# Patient Record
Sex: Female | Born: 1975 | Race: White | Hispanic: No | Marital: Single | Smoking: Current every day smoker
Health system: Southern US, Community
[De-identification: ages and names within clinical notes are randomized; demographics above are authoritative.]

## PROBLEM LIST (undated history)

## (undated) DIAGNOSIS — M1990 Unspecified osteoarthritis, unspecified site: Secondary | ICD-10-CM

## (undated) DIAGNOSIS — I10 Essential (primary) hypertension: Secondary | ICD-10-CM

## (undated) DIAGNOSIS — M797 Fibromyalgia: Secondary | ICD-10-CM

## (undated) DIAGNOSIS — J449 Chronic obstructive pulmonary disease, unspecified: Secondary | ICD-10-CM

## (undated) DIAGNOSIS — E785 Hyperlipidemia, unspecified: Secondary | ICD-10-CM

## (undated) HISTORY — PX: KIDNEY SURGERY: SHX687

## (undated) HISTORY — PX: TUBAL LIGATION: SHX77

---

## 1999-06-18 ENCOUNTER — Inpatient Hospital Stay (HOSPITAL_COMMUNITY): Admission: AD | Admit: 1999-06-18 | Discharge: 1999-06-18 | Payer: Self-pay | Admitting: *Deleted

## 1999-06-22 ENCOUNTER — Inpatient Hospital Stay (HOSPITAL_COMMUNITY): Admission: AD | Admit: 1999-06-22 | Discharge: 1999-06-22 | Payer: Self-pay | Admitting: Obstetrics

## 1999-07-13 ENCOUNTER — Ambulatory Visit (HOSPITAL_COMMUNITY): Admission: RE | Admit: 1999-07-13 | Discharge: 1999-07-13 | Payer: Self-pay | Admitting: *Deleted

## 1999-08-15 ENCOUNTER — Ambulatory Visit (HOSPITAL_COMMUNITY): Admission: RE | Admit: 1999-08-15 | Discharge: 1999-08-15 | Payer: Self-pay | Admitting: *Deleted

## 1999-09-24 ENCOUNTER — Inpatient Hospital Stay (HOSPITAL_COMMUNITY): Admission: AD | Admit: 1999-09-24 | Discharge: 1999-09-25 | Payer: Self-pay | Admitting: *Deleted

## 2001-01-11 ENCOUNTER — Inpatient Hospital Stay (HOSPITAL_COMMUNITY): Admission: AD | Admit: 2001-01-11 | Discharge: 2001-01-11 | Payer: Self-pay | Admitting: Obstetrics & Gynecology

## 2001-01-16 ENCOUNTER — Inpatient Hospital Stay (HOSPITAL_COMMUNITY): Admission: RE | Admit: 2001-01-16 | Discharge: 2001-01-16 | Payer: Self-pay | Admitting: Obstetrics & Gynecology

## 2001-01-16 ENCOUNTER — Encounter: Payer: Self-pay | Admitting: Obstetrics & Gynecology

## 2001-02-24 ENCOUNTER — Inpatient Hospital Stay (HOSPITAL_COMMUNITY): Admission: AD | Admit: 2001-02-24 | Discharge: 2001-02-26 | Payer: Self-pay | Admitting: Obstetrics

## 2002-10-31 ENCOUNTER — Inpatient Hospital Stay (HOSPITAL_COMMUNITY): Admission: AD | Admit: 2002-10-31 | Discharge: 2002-10-31 | Payer: Self-pay | Admitting: Family Medicine

## 2002-11-01 ENCOUNTER — Encounter: Payer: Self-pay | Admitting: *Deleted

## 2002-11-13 ENCOUNTER — Encounter: Admission: RE | Admit: 2002-11-13 | Discharge: 2002-11-13 | Payer: Self-pay | Admitting: *Deleted

## 2002-11-27 ENCOUNTER — Encounter: Admission: RE | Admit: 2002-11-27 | Discharge: 2002-11-27 | Payer: Self-pay | Admitting: *Deleted

## 2002-12-09 ENCOUNTER — Encounter: Admission: RE | Admit: 2002-12-09 | Discharge: 2002-12-09 | Payer: Self-pay | Admitting: *Deleted

## 2002-12-16 ENCOUNTER — Encounter: Admission: RE | Admit: 2002-12-16 | Discharge: 2002-12-16 | Payer: Self-pay | Admitting: *Deleted

## 2002-12-23 ENCOUNTER — Encounter: Admission: RE | Admit: 2002-12-23 | Discharge: 2002-12-23 | Payer: Self-pay | Admitting: *Deleted

## 2002-12-30 ENCOUNTER — Inpatient Hospital Stay (HOSPITAL_COMMUNITY): Admission: AD | Admit: 2002-12-30 | Discharge: 2003-01-01 | Payer: Self-pay | Admitting: *Deleted

## 2007-01-11 ENCOUNTER — Emergency Department: Payer: Self-pay | Admitting: Emergency Medicine

## 2007-05-12 ENCOUNTER — Emergency Department: Payer: Self-pay | Admitting: Emergency Medicine

## 2009-01-01 ENCOUNTER — Emergency Department: Payer: Self-pay | Admitting: Emergency Medicine

## 2013-09-04 ENCOUNTER — Emergency Department: Payer: Self-pay | Admitting: Emergency Medicine

## 2013-09-04 LAB — URINALYSIS, COMPLETE
Bilirubin,UR: NEGATIVE
Blood: NEGATIVE
Glucose,UR: NEGATIVE mg/dL (ref 0–75)
Ketone: NEGATIVE
Nitrite: POSITIVE
Ph: 6 (ref 4.5–8.0)
Protein: NEGATIVE
RBC,UR: 1 /HPF (ref 0–5)
Specific Gravity: 1.014 (ref 1.003–1.030)
Squamous Epithelial: 3
WBC UR: 7 /HPF (ref 0–5)

## 2013-11-01 ENCOUNTER — Emergency Department: Payer: Self-pay | Admitting: Emergency Medicine

## 2014-03-10 ENCOUNTER — Ambulatory Visit: Payer: Self-pay | Admitting: Family Medicine

## 2014-03-17 ENCOUNTER — Emergency Department: Payer: Self-pay | Admitting: Emergency Medicine

## 2014-08-18 ENCOUNTER — Emergency Department: Payer: Self-pay | Admitting: Internal Medicine

## 2014-12-23 ENCOUNTER — Other Ambulatory Visit: Payer: Self-pay

## 2014-12-23 ENCOUNTER — Emergency Department: Payer: Medicaid Other

## 2014-12-23 ENCOUNTER — Other Ambulatory Visit: Payer: Medicaid Other

## 2014-12-23 ENCOUNTER — Emergency Department
Admission: EM | Admit: 2014-12-23 | Discharge: 2014-12-23 | Disposition: A | Discharge status: Home / Self Care | Payer: Medicaid Other | Attending: Student | Admitting: Student

## 2014-12-23 DIAGNOSIS — R079 Chest pain, unspecified: Secondary | ICD-10-CM | POA: Insufficient documentation

## 2014-12-23 DIAGNOSIS — Z720 Tobacco use: Secondary | ICD-10-CM | POA: Diagnosis not present

## 2014-12-23 DIAGNOSIS — R4182 Altered mental status, unspecified: Secondary | ICD-10-CM | POA: Insufficient documentation

## 2014-12-23 DIAGNOSIS — R200 Anesthesia of skin: Secondary | ICD-10-CM | POA: Diagnosis present

## 2014-12-23 DIAGNOSIS — R202 Paresthesia of skin: Secondary | ICD-10-CM | POA: Diagnosis not present

## 2014-12-23 DIAGNOSIS — I10 Essential (primary) hypertension: Secondary | ICD-10-CM | POA: Insufficient documentation

## 2014-12-23 DIAGNOSIS — R51 Headache: Secondary | ICD-10-CM | POA: Diagnosis not present

## 2014-12-23 HISTORY — DX: Unspecified osteoarthritis, unspecified site: M19.90

## 2014-12-23 HISTORY — DX: Essential (primary) hypertension: I10

## 2014-12-23 HISTORY — DX: Fibromyalgia: M79.7

## 2014-12-23 HISTORY — DX: Hyperlipidemia, unspecified: E78.5

## 2014-12-23 LAB — CBC
HCT: 42.8 % (ref 35.0–47.0)
Hemoglobin: 14.8 g/dL (ref 12.0–16.0)
MCH: 31.9 pg (ref 26.0–34.0)
MCHC: 34.6 g/dL (ref 32.0–36.0)
MCV: 92 fL (ref 80.0–100.0)
Platelets: 246 10*3/uL (ref 150–440)
RBC: 4.65 MIL/uL (ref 3.80–5.20)
RDW: 13.2 % (ref 11.5–14.5)
WBC: 9 10*3/uL (ref 3.6–11.0)

## 2014-12-23 LAB — APTT: aPTT: 29 seconds (ref 24–36)

## 2014-12-23 LAB — COMPREHENSIVE METABOLIC PANEL
ALBUMIN: 3.9 g/dL (ref 3.5–5.0)
ALT: 30 U/L (ref 14–54)
ANION GAP: 7 (ref 5–15)
AST: 30 U/L (ref 15–41)
Alkaline Phosphatase: 64 U/L (ref 38–126)
BILIRUBIN TOTAL: 0.4 mg/dL (ref 0.3–1.2)
BUN: 9 mg/dL (ref 6–20)
CALCIUM: 9.6 mg/dL (ref 8.9–10.3)
CHLORIDE: 105 mmol/L (ref 101–111)
CO2: 25 mmol/L (ref 22–32)
Creatinine, Ser: 0.7 mg/dL (ref 0.44–1.00)
GFR calc non Af Amer: >60 mL/min (ref >60)
Glucose, Bld: 97 mg/dL (ref 65–99)
Potassium: 4.3 mmol/L (ref 3.5–5.1)
SODIUM: 137 mmol/L (ref 135–145)
TOTAL PROTEIN: 6.9 g/dL (ref 6.5–8.1)

## 2014-12-23 LAB — PROTIME-INR
INR: 0.88
Prothrombin Time: 12.1 seconds (ref 11.4–15.0)

## 2014-12-23 LAB — TROPONIN I: Troponin I: <0.03 ng/mL (ref <0.031)

## 2014-12-23 MED ORDER — METHYLPREDNISOLONE SODIUM SUCC 125 MG IJ SOLR: 125.0000 mg | Freq: Once | INTRAMUSCULAR | Status: DC

## 2014-12-23 NOTE — Discharge Instructions (Signed)
Please seek medical attention for any high fevers, chest pain, shortness of breath, change in behavior, persistent vomiting, bloody stool or any other new or concerning symptoms. ° °Paresthesia °Paresthesia is an abnormal burning or prickling sensation. This sensation is generally felt in the hands, arms, legs, or feet. However, it may occur in any part of the body. It is usually not painful. The feeling may be described as: °· Tingling or numbness. °· "Pins and needles." °· Skin crawling. °· Buzzing. °· Limbs "falling asleep." °· Itching. °Most people experience temporary (transient) paresthesia at some time in their lives. °CAUSES  °Paresthesia may occur when you breathe too quickly (hyperventilation). It can also occur without any apparent cause. Commonly, paresthesia occurs when pressure is placed on a nerve. The feeling quickly goes away once the pressure is removed. For some people, however, paresthesia is a long-lasting (chronic) condition caused by an underlying disorder. The underlying disorder may be: °· A traumatic, direct injury to nerves. Examples include a: °¨ Broken (fractured) neck. °¨ Fractured skull. °· A disorder affecting the brain and spinal cord (central nervous system). Examples include: °¨ Transverse myelitis. °¨ Encephalitis. °¨ Transient ischemic attack. °¨ Multiple sclerosis. °¨ Stroke. °¨ Tumor or blood vessel problems, such as an arteriovenous malformation pressing against the brain or spinal cord. °· A condition that damages the peripheral nerves (peripheral neuropathy). Peripheral nerves are not part of the brain and spinal cord. These conditions include: °¨ Diabetes. °¨ Peripheral vascular disease. °¨ Nerve entrapment syndromes, such as carpal tunnel syndrome. °¨ Shingles. °¨ Hypothyroidism. °¨ Vitamin B12 deficiencies. °¨ Alcoholism. °¨ Heavy metal poisoning (lead, arsenic). °¨ Rheumatoid arthritis. °¨ Systemic lupus erythematosus. °DIAGNOSIS  °Your caregiver will attempt to find the  underlying cause of your paresthesia. Your caregiver may: °· Take your medical history. °· Perform a physical exam. °· Order various lab tests. °· Order imaging tests. °TREATMENT  °Treatment for paresthesia depends on the underlying cause. °HOME CARE INSTRUCTIONS °· Avoid drinking alcohol. °· You may consider massage or acupuncture to help relieve your symptoms. °· Keep all follow-up appointments as directed by your caregiver. °SEEK IMMEDIATE MEDICAL CARE IF:  °· You feel weak. °· You have trouble walking or moving. °· You have problems with speech or vision. °· You feel confused. °· You cannot control your bladder or bowel movements. °· You feel numbness after an injury. °· You faint. °· Your burning or prickling feeling gets worse when walking. °· You have pain, cramps, or dizziness. °· You develop a rash. °MAKE SURE YOU: °· Understand these instructions. °· Will watch your condition. °· Will get help right away if you are not doing well or get worse. °Document Released: 05/12/2002 Document Revised: 08/14/2011 Document Reviewed: 02/10/2011 °[employer]® Patient Information ©2015 [employer]. This information is not intended to replace advice given to you by your health care provider. Make sure you discuss any questions you have with your health care provider. ° °

## 2014-12-23 NOTE — ED Notes (Addendum)
Pt c/o having central chest pain on Monday night before bed, then waking with right arm numbness yesterday with periods of confusion and HA.the patient states today she is having numbness to right arm and face with unsteady gait, HA.Marland Kitchen. No noted neuro deficits or facial drooping at present.

## 2014-12-23 NOTE — ED Provider Notes (Signed)
[facility] Emergency Department Provider Note  ____________________________________________  Time seen: Approximately 2:00 PM  I have reviewed the triage vital signs and the nursing notes.   HISTORY  Chief Complaint Chest Pain; Numbness; and Altered Mental Status    HPI Karen Russell is a 39 y.o. female with hypertension, hyperlipidemia, fibromyalgia who presents for evaluation of constant right upper extremity and right lower extremity numbness and tingling since yesterday, gradual onset, constant since onset, current severity is moderate. She generally feels weak. She has also had headache over the past 2 days however this has resolved at this time. Additionally, 2 nights ago she developed central chest pain however this is resolved and never returned. She feels as if she has numbness in her right face as well. No recent illness. No modifying factors. No vomiting, diarrhea, fevers, chills. No family history of early CVA   Past Medical History  Diagnosis Date  . Hypertension   . Hyperlipemia   . Arthritis   . Fibromyalgia syndrome     There are no active problems to display for this patient.   Past Surgical History  Procedure Laterality Date  . Tubal ligation    . Kidney surgery      No current outpatient prescriptions on file.  Allergies Review of patient's allergies indicates no known allergies.  No family history on file.  Social History History  Substance Use Topics  . Smoking status: Current Every Day Smoker -- 1.00 packs/day    Types: Cigarettes  . Smokeless tobacco: Never Used  . Alcohol Use: No    Review of Systems Constitutional: No fever/chills Eyes: No visual changes. ENT: No sore throat. Cardiovascular: +chest pain. Respiratory: Denies shortness of breath. Gastrointestinal: No abdominal pain.  No nausea, no vomiting.  No diarrhea.  No constipation. Genitourinary: Negative for dysuria. Musculoskeletal: Negative for back  pain. Skin: Negative for rash. Neurological: Positive for headache, no focal weakness, positive for numbness.  10-point ROS otherwise negative.  ____________________________________________   PHYSICAL EXAM:  VITAL SIGNS: ED Triage Vitals  Enc Vitals Group     BP 12/23/14 1336 131/56 mmHg     Pulse Rate 12/23/14 1336 79     Resp --      Temp 12/23/14 1336 98.7 F (37.1 C)     Temp Source 12/23/14 1336 Oral     SpO2 12/23/14 1336 98 %     Weight 12/23/14 1336 168 lb (76.204 kg)     Height 12/23/14 1336  (1.575 m)     Head Cir --      Peak Flow --      Pain Score 12/23/14 1346 7     Pain Loc --      Pain Edu? --      Excl. in GC? --     Constitutional: Alert and oriented. Well appearing and in no acute distress. Eyes: Conjunctivae are normal. PERRL. EOMI. Head: Atraumatic. Nose: No congestion/rhinnorhea. Mouth/Throat: Mucous membranes are moist.  Oropharynx non-erythematous. Neck: No stridor.  He is to palpation in the right trapezius. Cardiovascular: Normal rate, regular rhythm. Grossly normal heart sounds.  Good peripheral circulation. Respiratory: Normal respiratory effort.  No retractions. Lungs CTAB. Gastrointestinal: Soft and nontender. No distention. No abdominal bruits. No CVA tenderness. Genitourinary: Deferred Musculoskeletal: No lower extremity tenderness nor edema.  No joint effusions. Neurologic:  Normal speech and language. Nerves II through XII intact. Normal finger-nose-finger. Plus out of 5 strength in the right upper extremity however her exam is  limited secondary to pain she is having in the right arm. 5 out of 5 strength in the remaining extremities. She has hyper algesia in the right upper cavity and right lower extremity. Skin:  Skin is warm, dry and intact. No rash noted. Psychiatric: Mood and affect are normal. Speech and behavior are normal.  ____________________________________________   LABS (all labs ordered are listed, but only abnormal  results are displayed)  Labs Reviewed  CBC  TROPONIN I  APTT  PROTIME-INR  COMPREHENSIVE METABOLIC PANEL  URINALYSIS COMPLETEWITH MICROSCOPIC ([facility] ONLY)  POC URINE PREG, ED   ____________________________________________  EKG  ED ECG REPORT I, Gayla DossGayle, Pharrell Ledford A, the attending physician, personally viewed and interpreted this ECG.   Date: 12/23/2014  EKG Time: 13:55  Rate: 100  Rhythm: normal sinus rhythm  Axis: normal  Intervals:none  ST&T Change: No acute ST segment elevation.  ____________________________________________  RADIOLOGY  CT head IMPRESSION: Stable and normal noncontrast CT appearance of the brain.   CXR IMPRESSION: No radiographic evidence of acute cardiopulmonary disease.  ____________________________________________   PROCEDURES  Procedure(s) performed: None  Critical Care performed: No  ____________________________________________   INITIAL IMPRESSION / ASSESSMENT AND PLAN / ED COURSE  Pertinent labs & imaging results that were available during my care of the patient were reviewed by me and considered in my medical decision making (see chart for details).  Karen Russell is a 39 y.o. female with hypertension, hyperlipidemia, fibromyalgia who presents for evaluation of constant right upper extremity and right lower extremity numbness and tingling since yesterday, gradual onset, constant since onset, current severity is moderate. Vital signs stable. She is afebrile. She had one episode of nonspecific chest pain 2 nights ago which resolved. EKG is reassuring, troponin negative. Doubt ACS. She is PERC negative and I doubt PE. Not consistent with acute aortic dissection. She has hyperalgesia in the right upper extremity, right lower extremity, mild weakness in the right upper extremity which I suspect is secondary to pain. CT head negative for any acute intracranial process. She is not a candidate for TPA as she is outside of the time frame/has been  having symptoms constantly since yesterday. Plan for MRI brain/C-spine to rule out stroke or acute cervical myelopathy. Care transferred to Dr. Derrill KayGoodman at 3:30 PM. ____________________________________________   FINAL CLINICAL IMPRESSION(S) / ED DIAGNOSES  Final diagnoses:  Numbness and tingling of right arm and leg      Gayla DossEryka A Glendine Swetz, MD 12/23/14 1535

## 2014-12-23 NOTE — ED Notes (Signed)
Patient transported to X-ray 

## 2014-12-23 NOTE — ED Provider Notes (Signed)
-----------------------------------------   5:39 PM on 12/23/2014 -----------------------------------------  Patient's MRI without any concerning findings. This point feel patient is safe for discharge home. I did discuss follow-up with patient. Will give neurology follow-up. Discussed return precautions with patient.  Phineas SemenGraydon Catelynn Sparger, MD 12/23/14 2121

## 2015-06-26 ENCOUNTER — Encounter: Payer: Self-pay | Admitting: Emergency Medicine

## 2015-06-26 ENCOUNTER — Emergency Department
Admission: EM | Admit: 2015-06-26 | Discharge: 2015-06-26 | Disposition: A | Discharge status: Home / Self Care | Payer: Medicaid Other | Attending: Emergency Medicine | Admitting: Emergency Medicine

## 2015-06-26 ENCOUNTER — Emergency Department: Payer: Medicaid Other

## 2015-06-26 DIAGNOSIS — I10 Essential (primary) hypertension: Secondary | ICD-10-CM | POA: Insufficient documentation

## 2015-06-26 DIAGNOSIS — J209 Acute bronchitis, unspecified: Secondary | ICD-10-CM | POA: Diagnosis not present

## 2015-06-26 DIAGNOSIS — R05 Cough: Secondary | ICD-10-CM | POA: Diagnosis present

## 2015-06-26 DIAGNOSIS — F17210 Nicotine dependence, cigarettes, uncomplicated: Secondary | ICD-10-CM | POA: Insufficient documentation

## 2015-06-26 MED ORDER — PREDNISONE 20 MG PO TABS
60.0000 mg | ORAL_TABLET | Freq: Once | ORAL | Status: AC
  Administered 2015-06-26: 60 mg via ORAL
  Filled 2015-06-26: qty 3

## 2015-06-26 MED ORDER — PREDNISONE 10 MG (21) PO TBPK: ORAL_TABLET | ORAL | Status: DC

## 2015-06-26 MED ORDER — HYDROCODONE-HOMATROPINE 5-1.5 MG/5ML PO SYRP: 5.0000 mL | ORAL_SOLUTION | Freq: Four times a day (QID) | ORAL | Status: DC | PRN

## 2015-06-26 MED ORDER — AMOXICILLIN 500 MG PO CAPS
500.0000 mg | ORAL_CAPSULE | Freq: Once | ORAL | Status: AC
  Administered 2015-06-26: 500 mg via ORAL
  Filled 2015-06-26: qty 1

## 2015-06-26 MED ORDER — AMOXICILLIN 500 MG PO TABS: 500.0000 mg | ORAL_TABLET | Freq: Three times a day (TID) | ORAL | Status: DC

## 2015-06-26 NOTE — Discharge Instructions (Signed)
Acute Bronchitis Bronchitis is inflammation of the airways that extend from the windpipe into the lungs (bronchi). The inflammation often causes mucus to develop. This leads to a cough, which is the most common symptom of bronchitis.  In acute bronchitis, the condition usually develops suddenly and goes away over time, usually in a couple weeks. Smoking, allergies, and asthma can make bronchitis worse. Repeated episodes of bronchitis may cause further lung problems.  CAUSES Acute bronchitis is most often caused by the same virus that causes a cold. The virus can spread from person to person (contagious) through coughing, sneezing, and touching contaminated objects. SIGNS AND SYMPTOMS   Cough.   Fever.   Coughing up mucus.   Body aches.   Chest congestion.   Chills.   Shortness of breath.   Sore throat.  DIAGNOSIS  Acute bronchitis is usually diagnosed through a physical exam. Your health care provider will also ask you questions about your medical history. Tests, such as chest X-rays, are sometimes done to rule out other conditions.  TREATMENT  Acute bronchitis usually goes away in a couple weeks. Oftentimes, no medical treatment is necessary. Medicines are sometimes given for relief of fever or cough. Antibiotic medicines are usually not needed but may be prescribed in certain situations. In some cases, an inhaler may be recommended to help reduce shortness of breath and control the cough. A cool mist vaporizer may also be used to help thin bronchial secretions and make it easier to clear the chest.  HOME CARE INSTRUCTIONS  Get plenty of rest.   Drink enough fluids to keep your urine clear or pale yellow (unless you have a medical condition that requires fluid restriction). Increasing fluids may help thin your respiratory secretions (sputum) and reduce chest congestion, and it will prevent dehydration.   Take medicines only as directed by your health care provider.  If  you were prescribed an antibiotic medicine, finish it all even if you start to feel better.  Avoid smoking and secondhand smoke. Exposure to cigarette smoke or irritating chemicals will make bronchitis worse. If you are a smoker, consider using nicotine gum or skin patches to help control withdrawal symptoms. Quitting smoking will help your lungs heal faster.   Reduce the chances of another bout of acute bronchitis by washing your hands frequently, avoiding people with cold symptoms, and trying not to touch your hands to your mouth, nose, or eyes.   Keep all follow-up visits as directed by your health care provider.  SEEK MEDICAL CARE IF: Your symptoms do not improve after 1 week of treatment.  SEEK IMMEDIATE MEDICAL CARE IF:  You develop an increased fever or chills.   You have chest pain.   You have severe shortness of breath.  You have bloody sputum.   You develop dehydration.  You faint or repeatedly feel like you are going to pass out.  You develop repeated vomiting.  You develop a severe headache. MAKE SURE YOU:   Understand these instructions.  Will watch your condition.  Will get help right away if you are not doing well or get worse.   This information is not intended to replace advice given to you by your health care provider. Make sure you discuss any questions you have with your health care provider.   Document Released: 06/29/2004 Document Revised: 06/12/2014 Document Reviewed: 11/12/2012 Elsevier Interactive Patient Education 2016 [employer]    Take antibiotics and cough medicine as directed. Encourage you to stop smoking as you're able. Symptoms  should improve over the next several days. If worsening return to the emergency room.

## 2015-06-26 NOTE — ED Provider Notes (Signed)
[facility] Emergency Department Provider Note  ____________________________________________  Time seen: Approximately 3:52 PM  I have reviewed the triage vital signs and the nursing notes.   HISTORY  Chief Complaint Cough    HPI Karen Russell is a 40 y.o. female with history of hypertension, fibromyalgia and arthritis who presents with persisting cough for the last few months. Has been worse this week. She has some shortness of breath, wheezing, head congestion with sinus pressure. She also has sore throat and your pain. She does smoke.   Past Medical History  Diagnosis Date  . Hypertension   . Hyperlipemia   . Arthritis   . Fibromyalgia syndrome     There are no active problems to display for this patient.   Past Surgical History  Procedure Laterality Date  . Tubal ligation    . Kidney surgery      Current Outpatient Rx  Name  Route  Sig  Dispense  Refill  . amoxicillin (AMOXIL) 500 MG tablet   Oral   Take 1 tablet (500 mg total) by mouth 3 (three) times daily.   30 tablet   0   . HYDROcodone-homatropine (HYCODAN) 5-1.5 MG/5ML syrup   Oral   Take 5 mLs by mouth every 6 (six) hours as needed for cough.   120 mL   0   . predniSONE (STERAPRED UNI-PAK 21 TAB) 10 MG (21) TBPK tablet      6 tablets on day 1, 5 tablets on day 2, 4 tablets on day 3, etc...   21 tablet   0     Allergies Review of patient's allergies indicates no known allergies.  No family history on file.  Social History Social History  Substance Use Topics  . Smoking status: Current Every Day Smoker -- 0.50 packs/day    Types: Cigarettes  . Smokeless tobacco: Never Used  . Alcohol Use: No    Review of Systems Constitutional: No fever/chills Eyes: No visual changes. ENT: sore throat.  As per HPI Cardiovascular: Denies chest pain. Respiratory:  shortness of breath. Gastrointestinal: No abdominal pain.  No nausea, no vomiting.  diarrhea.  No  constipation. Genitourinary: Negative for dysuria. Musculoskeletal: Negative for back pain. Skin: Negative for rash. Neurological: Negative for headaches, focal weakness or numbness. 10-point ROS otherwise negative.  ____________________________________________   PHYSICAL EXAM:  VITAL SIGNS: ED Triage Vitals  Enc Vitals Group     BP 06/26/15 1508 140/90 mmHg     Pulse Rate 06/26/15 1508 116     Resp 06/26/15 1508 20     Temp 06/26/15 1508 99 F (37.2 C)     Temp Source 06/26/15 1508 Oral     SpO2 06/26/15 1508 97 %     Weight 06/26/15 1508 160 lb (72.576 kg)     Height 06/26/15 1508  (1.575 m)     Head Cir --      Peak Flow --      Pain Score 06/26/15 1509 10     Pain Loc --      Pain Edu? --      Excl. in GC? --     Constitutional: Alert and oriented. Well appearing and in no acute distress. Eyes: Conjunctivae are normal. PERRL. EOMI. Ears:  Clear with normal landmarks. No erythema. Head: Atraumatic. Nose: No congestion/rhinnorhea. Mouth/Throat: Mucous membranes are moist.  Oropharynx non-erythematous. No lesions. Neck:  Supple.  No adenopathy.   Cardiovascular: Normal rate, regular rhythm. Grossly normal heart sounds.  Good peripheral circulation. Respiratory: Normal respiratory effort.  No retractions. Lungs CTAB. Gastrointestinal: Soft and nontender. No distention. No abdominal bruits. No CVA tenderness. Musculoskeletal: Nml ROM of upper and lower extremity joints. Neurologic:  Normal speech and language. No gross focal neurologic deficits are appreciated. No gait instability. Skin:  Skin is warm, dry and intact. No rash noted. Psychiatric: Mood and affect are normal. Speech and behavior are normal.  ____________________________________________   LABS (all labs ordered are listed, but only abnormal results are displayed)  Labs Reviewed - No data to  display ____________________________________________  EKG   ____________________________________________  RADIOLOGY  CLINICAL DATA: Cough on all for 3 months  EXAM: CHEST - 2 VIEW  COMPARISON: 12/23/2014  FINDINGS: The heart size and mediastinal contours are within normal limits. Both lungs are clear. The visualized skeletal structures are unremarkable.  IMPRESSION: No active disease.   Electronically Signed  By: Alcide Clever M.D.  On: 06/26/2015 16:17  ____________________________________________   PROCEDURES  Procedure(s) performed: None  Critical Care performed: No  ____________________________________________   INITIAL IMPRESSION / ASSESSMENT AND PLAN / ED COURSE  Pertinent labs & imaging results that were available during my care of the patient were reviewed by me and considered in my medical decision making (see chart for details).  40 year old female with history of smoking who presents with worsening cough over the last week. She has had cough and congestion for approximately 2 months. Chest x-ray is clear today. Concern for bronchitis aggravated by her smoking history. Given amoxicillin, prednisone taper and Hycodan. We'll follow up with urgent care or return to emergency room if not improving. ____________________________________________   FINAL CLINICAL IMPRESSION(S) / ED DIAGNOSES  Final diagnoses:  Acute bronchitis, unspecified organism      Ignacia Bayley, PA-C 06/26/15 1658  Arnaldo Natal, MD 06/26/15 [phone]

## 2015-06-26 NOTE — ED Notes (Signed)
Patient transported to X-ray 

## 2015-06-26 NOTE — ED Notes (Signed)
Intermittent cough x 4 months.

## 2015-06-26 NOTE — ED Notes (Signed)
Assessed per PA 

## 2015-09-03 ENCOUNTER — Encounter: Payer: Self-pay | Admitting: Emergency Medicine

## 2015-09-03 ENCOUNTER — Emergency Department: Payer: Medicaid Other

## 2015-09-03 ENCOUNTER — Emergency Department
Admission: EM | Admit: 2015-09-03 | Discharge: 2015-09-03 | Disposition: A | Discharge status: Home / Self Care | Payer: Medicaid Other | Attending: Emergency Medicine | Admitting: Emergency Medicine

## 2015-09-03 DIAGNOSIS — M1990 Unspecified osteoarthritis, unspecified site: Secondary | ICD-10-CM | POA: Insufficient documentation

## 2015-09-03 DIAGNOSIS — M545 Low back pain, unspecified: Secondary | ICD-10-CM

## 2015-09-03 DIAGNOSIS — N390 Urinary tract infection, site not specified: Secondary | ICD-10-CM | POA: Diagnosis not present

## 2015-09-03 DIAGNOSIS — M797 Fibromyalgia: Secondary | ICD-10-CM | POA: Diagnosis not present

## 2015-09-03 DIAGNOSIS — Y9389 Activity, other specified: Secondary | ICD-10-CM | POA: Insufficient documentation

## 2015-09-03 DIAGNOSIS — F17210 Nicotine dependence, cigarettes, uncomplicated: Secondary | ICD-10-CM | POA: Diagnosis not present

## 2015-09-03 DIAGNOSIS — E785 Hyperlipidemia, unspecified: Secondary | ICD-10-CM | POA: Insufficient documentation

## 2015-09-03 DIAGNOSIS — I10 Essential (primary) hypertension: Secondary | ICD-10-CM | POA: Diagnosis not present

## 2015-09-03 DIAGNOSIS — Y92410 Unspecified street and highway as the place of occurrence of the external cause: Secondary | ICD-10-CM | POA: Diagnosis not present

## 2015-09-03 DIAGNOSIS — Y999 Unspecified external cause status: Secondary | ICD-10-CM | POA: Diagnosis not present

## 2015-09-03 LAB — URINALYSIS COMPLETE WITH MICROSCOPIC (ARMC ONLY)
BILIRUBIN URINE: NEGATIVE
Glucose, UA: NEGATIVE mg/dL
Hgb urine dipstick: NEGATIVE
KETONES UR: NEGATIVE mg/dL
Nitrite: POSITIVE — AB
PH: 7 (ref 5.0–8.0)
Protein, ur: NEGATIVE mg/dL
SPECIFIC GRAVITY, URINE: 1.01 (ref 1.005–1.030)

## 2015-09-03 LAB — POCT PREGNANCY, URINE: Preg Test, Ur: NEGATIVE

## 2015-09-03 MED ORDER — CIPROFLOXACIN HCL 500 MG PO TABS: 500.0000 mg | ORAL_TABLET | Freq: Two times a day (BID) | ORAL | Status: DC

## 2015-09-03 MED ORDER — HYDROCODONE-ACETAMINOPHEN 5-325 MG PO TABS: 1.0000 | ORAL_TABLET | ORAL | Status: DC | PRN

## 2015-09-03 MED ORDER — HYDROCODONE-ACETAMINOPHEN 5-325 MG PO TABS
1.0000 | ORAL_TABLET | Freq: Once | ORAL | Status: AC
  Administered 2015-09-03: 1 via ORAL
  Filled 2015-09-03: qty 1

## 2015-09-03 MED ORDER — DIAZEPAM 2 MG PO TABS
2.0000 mg | ORAL_TABLET | Freq: Once | ORAL | Status: AC
  Administered 2015-09-03: 2 mg via ORAL
  Filled 2015-09-03: qty 1

## 2015-09-03 MED ORDER — CIPROFLOXACIN HCL 500 MG PO TABS
500.0000 mg | ORAL_TABLET | Freq: Once | ORAL | Status: AC
  Administered 2015-09-03: 500 mg via ORAL
  Filled 2015-09-03: qty 1

## 2015-09-03 NOTE — ED Notes (Signed)
Reviewed d/c instructions, follow-up care, prescriptions with pt. Pt verbalized understanding.  
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
## 2016-04-24 ENCOUNTER — Emergency Department
Admission: EM | Admit: 2016-04-24 | Discharge: 2016-04-24 | Disposition: A | Discharge status: Home / Self Care | Payer: Self-pay | Attending: Emergency Medicine | Admitting: Emergency Medicine

## 2016-04-24 ENCOUNTER — Emergency Department: Payer: Self-pay

## 2016-04-24 ENCOUNTER — Encounter: Payer: Self-pay | Admitting: *Deleted

## 2016-04-24 DIAGNOSIS — F17210 Nicotine dependence, cigarettes, uncomplicated: Secondary | ICD-10-CM | POA: Insufficient documentation

## 2016-04-24 DIAGNOSIS — I10 Essential (primary) hypertension: Secondary | ICD-10-CM | POA: Insufficient documentation

## 2016-04-24 DIAGNOSIS — J40 Bronchitis, not specified as acute or chronic: Secondary | ICD-10-CM | POA: Insufficient documentation

## 2016-04-24 DIAGNOSIS — Z79899 Other long term (current) drug therapy: Secondary | ICD-10-CM | POA: Insufficient documentation

## 2016-04-24 MED ORDER — AZITHROMYCIN 250 MG PO TABS: ORAL_TABLET | ORAL | 0 refills | Status: AC

## 2016-04-24 MED ORDER — IBUPROFEN 600 MG PO TABS: 600.0000 mg | ORAL_TABLET | Freq: Three times a day (TID) | ORAL | 0 refills | Status: DC | PRN

## 2016-04-24 MED ORDER — HYDROCOD POLST-CPM POLST ER 10-8 MG/5ML PO SUER: 5.0000 mL | Freq: Two times a day (BID) | ORAL | 0 refills | Status: DC

## 2016-04-24 MED ORDER — HYDROCOD POLST-CPM POLST ER 10-8 MG/5ML PO SUER
5.0000 mL | Freq: Once | ORAL | Status: AC
  Administered 2016-04-24: 5 mL via ORAL
  Filled 2016-04-24: qty 5

## 2016-04-24 NOTE — ED Notes (Signed)
See triage note  Cough for several day  Became worse yesterdya  Pain increases with cough and inspiration to chest

## 2016-04-24 NOTE — ED Triage Notes (Signed)
States URI for over 1 week, states she is coughing up white sputum and has back pain when she coughs, awake and alert In no acute distress

## 2016-04-24 NOTE — ED Provider Notes (Signed)
[facility] Emergency Department Provider Note   ____________________________________________   First MD Initiated Contact with Patient 04/24/16 1337     (approximate)  I have reviewed the triage vital signs and the nursing notes.   HISTORY  Chief Complaint Cough and Back Pain    HPI Karen Russell is a 40 y.o. female patient complaint URI signs and symptoms consistent mostly of productive cough 1 week. Patient to cough spell causes chest and body ache. Patient denies nasal congestion or rhinorrhea. Patient denies sore throat. Patient denies nausea vomiting or diarrhea. No palliative measures taken for this complaint.Patient rates her pain as a 10 over 10. Patient described a pain as "achy".  Past Medical History:  Diagnosis Date  . Arthritis   . Fibromyalgia syndrome   . Hyperlipemia   . Hypertension     There are no active problems to display for this patient.   Past Surgical History:  Procedure Laterality Date  . KIDNEY SURGERY    . TUBAL LIGATION      Prior to Admission medications   Medication Sig Start Date End Date Taking? Authorizing Provider  gabapentin (NEURONTIN) 400 MG capsule Take 400 mg by mouth 3 (three) times daily.   Yes Historical Provider, MD  lisinopril (PRINIVIL,ZESTRIL) 20 MG tablet Take 20 mg by mouth daily.   Yes Historical Provider, MD  simvastatin (ZOCOR) 20 MG tablet Take 20 mg by mouth daily.   Yes Historical Provider, MD  amoxicillin (AMOXIL) 500 MG tablet Take 1 tablet (500 mg total) by mouth 3 (three) times daily. 06/26/15   Ignacia Bayleyobert Tumey, PA-C  azithromycin (ZITHROMAX Z-PAK) 250 MG tablet Take 2 tablets (500 mg) on  Day 1,  followed by 1 tablet (250 mg) once daily on Days 2 through 5. 04/24/16 04/29/16  Joni Reiningonald K Smith, PA-C  chlorpheniramine-HYDROcodone (TUSSIONEX PENNKINETIC ER) 10-8 MG/5ML SUER Take 5 mLs by mouth 2 (two) times daily. 04/24/16   Joni Reiningonald K Smith, PA-C  ciprofloxacin (CIPRO) 500 MG tablet Take 1  tablet (500 mg total) by mouth 2 (two) times daily. 09/03/15   Tommi Rumpshonda L Summers, PA-C  HYDROcodone-acetaminophen (NORCO/VICODIN) 5-325 MG tablet Take 1 tablet by mouth every 4 (four) hours as needed for moderate pain. 09/03/15   Tommi Rumpshonda L Summers, PA-C  HYDROcodone-homatropine [facility](HYCODAN) 5-1.5 MG/5ML syrup Take 5 mLs by mouth every 6 (six) hours as needed for cough. 06/26/15   Ignacia Bayleyobert Tumey, PA-C  ibuprofen (ADVIL,MOTRIN) 600 MG tablet Take 1 tablet (600 mg total) by mouth every 8 (eight) hours as needed. 04/24/16   Joni Reiningonald K Smith, PA-C  predniSONE (STERAPRED UNI-PAK 21 TAB) 10 MG (21) TBPK tablet 6 tablets on day 1, 5 tablets on day 2, 4 tablets on day 3, etc... 06/26/15   Ignacia Bayleyobert Tumey, PA-C    Allergies Patient has no known allergies.  History reviewed. No pertinent family history.  Social History Social History  Substance Use Topics  . Smoking status: Current Every Day Smoker    Packs/day: 0.50    Types: Cigarettes  . Smokeless tobacco: Never Used  . Alcohol use No    Review of Systems Constitutional: No fever/chills Eyes: No visual changes. ENT: No sore throat. Cardiovascular: Denies chest pain. Respiratory: Denies shortness of breath. Gastrointestinal: No abdominal pain.  No nausea, no vomiting.  No diarrhea.  No constipation. Genitourinary: Negative for dysuria. Musculoskeletal: Negative for back pain. Skin: Negative for rash. Neurological: Negative for headaches, focal weakness or numbness. Endocrine:Hypertension and hyperlipidemia  ____________________________________________   PHYSICAL  EXAM:  VITAL SIGNS: ED Triage Vitals  Enc Vitals Group     BP 04/24/16 1315 136/78     Pulse Rate 04/24/16 1315 90     Resp 04/24/16 1315 18     Temp 04/24/16 1315 98.3 F (36.8 C)     Temp Source 04/24/16 1315 Oral     SpO2 04/24/16 1315 98 %     Weight 04/24/16 1315 150 lb (68 kg)     Height 04/24/16 1315 5\' 2"  (1.575 m)     Head Circumference --      Peak Flow --      Pain  Score 04/24/16 1316 10     Pain Loc --      Pain Edu? --      Excl. in GC? --     Constitutional: Alert and oriented. Well appearing and in no acute distress. Eyes: Conjunctivae are normal. PERRL. EOMI. Head: Atraumatic. Nose: No congestion/rhinnorhea. Mouth/Throat: Mucous membranes are moist.  Oropharynx non-erythematous. Neck: No stridor.  No cervical spine tenderness to palpation. Hematological/Lymphatic/Immunilogical: No cervical lymphadenopathy. Cardiovascular: Normal rate, regular rhythm. Grossly normal heart sounds.  Good peripheral circulation. Respiratory: Normal respiratory effort.  No retractions. Bilateral rhonchi breath sounds Gastrointestinal: Soft and nontender. No distention. No abdominal bruits. No CVA tenderness. Musculoskeletal: No lower extremity tenderness nor edema.  No joint effusions. Neurologic:  Normal speech and language. No gross focal neurologic deficits are appreciated. No gait instability. Skin:  Skin is warm, dry and intact. No rash noted. Psychiatric: Mood and affect are normal. Speech and behavior are normal.  ____________________________________________   LABS (all labs ordered are listed, but only abnormal results are displayed)  Labs Reviewed - No data to display ____________________________________________  EKG   ____________________________________________  RADIOLOGY Chest x-ray revealed hyperinflated lungs. No other acute findings.  ____________________________________________   PROCEDURES  Procedure(s) performed: None  Procedures  Critical Care performed: No  ____________________________________________   INITIAL IMPRESSION / ASSESSMENT AND PLAN / ED COURSE  Pertinent labs & imaging results that were available during my care of the patient were reviewed by me and considered in my medical decision making (see chart for details).  Bronchitis. She given discharge care instructions. Patient get a prescription for Zithromax,  Tussionex, and ibuprofen. Patient advised follow-up primary doctor condition persists.  Clinical Course      ____________________________________________   FINAL CLINICAL IMPRESSION(S) / ED DIAGNOSES  Final diagnoses:  Bronchitis      NEW MEDICATIONS STARTED DURING THIS VISIT:  New Prescriptions   AZITHROMYCIN (ZITHROMAX Z-PAK) 250 MG TABLET    Take 2 tablets (500 mg) on  Day 1,  followed by 1 tablet (250 mg) once daily on Days 2 through 5.   CHLORPHENIRAMINE-HYDROCODONE (TUSSIONEX PENNKINETIC ER) 10-8 MG/5ML SUER    Take 5 mLs by mouth 2 (two) times daily.   IBUPROFEN (ADVIL,MOTRIN) 600 MG TABLET    Take 1 tablet (600 mg total) by mouth every 8 (eight) hours as needed.     Note:  This document was prepared using Dragon voice recognition software and may include unintentional dictation errors.    Joni Reiningonald K Smith, PA-C 04/24/16 1427    Arnaldo NatalPaul F Malinda, MD 04/24/16 [phone]

## 2016-08-10 ENCOUNTER — Encounter: Payer: Self-pay | Admitting: *Deleted

## 2016-08-10 ENCOUNTER — Emergency Department: Payer: Medicaid Other

## 2016-08-10 ENCOUNTER — Inpatient Hospital Stay
Admission: EM | Admit: 2016-08-10 | Discharge: 2016-08-13 | DRG: 190 | Disposition: A | Discharge status: Home / Self Care | Payer: Medicaid Other | Attending: Internal Medicine | Admitting: Internal Medicine

## 2016-08-10 DIAGNOSIS — J45901 Unspecified asthma with (acute) exacerbation: Secondary | ICD-10-CM | POA: Diagnosis present

## 2016-08-10 DIAGNOSIS — J441 Chronic obstructive pulmonary disease with (acute) exacerbation: Secondary | ICD-10-CM

## 2016-08-10 DIAGNOSIS — M797 Fibromyalgia: Secondary | ICD-10-CM | POA: Diagnosis present

## 2016-08-10 DIAGNOSIS — J9601 Acute respiratory failure with hypoxia: Secondary | ICD-10-CM | POA: Diagnosis present

## 2016-08-10 DIAGNOSIS — J440 Chronic obstructive pulmonary disease with acute lower respiratory infection: Secondary | ICD-10-CM | POA: Diagnosis present

## 2016-08-10 DIAGNOSIS — J209 Acute bronchitis, unspecified: Secondary | ICD-10-CM | POA: Diagnosis present

## 2016-08-10 DIAGNOSIS — J9691 Respiratory failure, unspecified with hypoxia: Secondary | ICD-10-CM | POA: Diagnosis present

## 2016-08-10 DIAGNOSIS — I10 Essential (primary) hypertension: Secondary | ICD-10-CM | POA: Diagnosis present

## 2016-08-10 DIAGNOSIS — K219 Gastro-esophageal reflux disease without esophagitis: Secondary | ICD-10-CM | POA: Diagnosis present

## 2016-08-10 DIAGNOSIS — Z79899 Other long term (current) drug therapy: Secondary | ICD-10-CM

## 2016-08-10 DIAGNOSIS — F17210 Nicotine dependence, cigarettes, uncomplicated: Secondary | ICD-10-CM | POA: Diagnosis present

## 2016-08-10 DIAGNOSIS — E785 Hyperlipidemia, unspecified: Secondary | ICD-10-CM | POA: Diagnosis present

## 2016-08-10 HISTORY — DX: Respiratory failure, unspecified with hypoxia: J96.91

## 2016-08-10 LAB — BASIC METABOLIC PANEL
ANION GAP: 8 (ref 5–15)
BUN: 8 mg/dL (ref 6–20)
CALCIUM: 9.2 mg/dL (ref 8.9–10.3)
CO2: 23 mmol/L (ref 22–32)
Chloride: 105 mmol/L (ref 101–111)
Creatinine, Ser: 0.42 mg/dL — ABNORMAL LOW (ref 0.44–1.00)
Glucose, Bld: 126 mg/dL — ABNORMAL HIGH (ref 65–99)
Potassium: 4.5 mmol/L (ref 3.5–5.1)
SODIUM: 136 mmol/L (ref 135–145)

## 2016-08-10 LAB — CBC
HCT: 43.5 % (ref 35.0–47.0)
HEMOGLOBIN: 15.1 g/dL (ref 12.0–16.0)
MCH: 31.6 pg (ref 26.0–34.0)
MCHC: 34.7 g/dL (ref 32.0–36.0)
MCV: 91 fL (ref 80.0–100.0)
PLATELETS: 325 10*3/uL (ref 150–440)
RBC: 4.78 MIL/uL (ref 3.80–5.20)
RDW: 13.9 % (ref 11.5–14.5)
WBC: 14.8 10*3/uL — AB (ref 3.6–11.0)

## 2016-08-10 LAB — TROPONIN I

## 2016-08-10 MED ORDER — ALBUTEROL SULFATE (2.5 MG/3ML) 0.083% IN NEBU
2.5000 mg | INHALATION_SOLUTION | Freq: Four times a day (QID) | RESPIRATORY_TRACT | Status: DC | PRN
  Administered 2016-08-11: 2.5 mg via RESPIRATORY_TRACT
  Filled 2016-08-10: qty 3

## 2016-08-10 MED ORDER — NICOTINE 21 MG/24HR TD PT24
21.0000 mg | MEDICATED_PATCH | Freq: Once | TRANSDERMAL | Status: AC
  Administered 2016-08-10: 21 mg via TRANSDERMAL
  Filled 2016-08-10: qty 1

## 2016-08-10 MED ORDER — CYCLOBENZAPRINE HCL 10 MG PO TABS
5.0000 mg | ORAL_TABLET | Freq: Three times a day (TID) | ORAL | Status: DC | PRN
Start: 2016-08-10 — End: 2016-08-13
  Administered 2016-08-10 – 2016-08-12 (×6): 5 mg via ORAL
  Filled 2016-08-10 (×7): qty 1

## 2016-08-10 MED ORDER — AZITHROMYCIN 500 MG PO TABS
500.0000 mg | ORAL_TABLET | Freq: Once | ORAL | Status: AC
  Administered 2016-08-10: 500 mg via ORAL
  Filled 2016-08-10: qty 1

## 2016-08-10 MED ORDER — SIMVASTATIN 20 MG PO TABS
20.0000 mg | ORAL_TABLET | Freq: Every day | ORAL | Status: DC
  Administered 2016-08-10 – 2016-08-12 (×3): 20 mg via ORAL
  Filled 2016-08-10 (×3): qty 1

## 2016-08-10 MED ORDER — POLYETHYLENE GLYCOL 3350 17 G PO PACK: 17.0000 g | PACK | Freq: Every day | ORAL | Status: DC | PRN

## 2016-08-10 MED ORDER — IPRATROPIUM-ALBUTEROL 0.5-2.5 (3) MG/3ML IN SOLN
3.0000 mL | Freq: Once | RESPIRATORY_TRACT | Status: AC
  Administered 2016-08-10: 3 mL via RESPIRATORY_TRACT
  Filled 2016-08-10: qty 3

## 2016-08-10 MED ORDER — SODIUM CHLORIDE 0.9 % IV BOLUS (SEPSIS)
1000.0000 mL | Freq: Once | INTRAVENOUS | Status: AC
  Administered 2016-08-10: 1000 mL via INTRAVENOUS

## 2016-08-10 MED ORDER — ACETAMINOPHEN 325 MG PO TABS
650.0000 mg | ORAL_TABLET | Freq: Four times a day (QID) | ORAL | Status: DC | PRN
  Administered 2016-08-11: 650 mg via ORAL
  Filled 2016-08-10: qty 2

## 2016-08-10 MED ORDER — AZITHROMYCIN 250 MG PO TABS
250.0000 mg | ORAL_TABLET | Freq: Every day | ORAL | Status: DC
  Administered 2016-08-11: 250 mg via ORAL
  Filled 2016-08-10: qty 1

## 2016-08-10 MED ORDER — ALBUTEROL SULFATE (2.5 MG/3ML) 0.083% IN NEBU
5.0000 mg | INHALATION_SOLUTION | Freq: Once | RESPIRATORY_TRACT | Status: AC
  Administered 2016-08-10: 5 mg via RESPIRATORY_TRACT

## 2016-08-10 MED ORDER — IPRATROPIUM-ALBUTEROL 0.5-2.5 (3) MG/3ML IN SOLN
RESPIRATORY_TRACT | Status: AC
  Administered 2016-08-10: 3 mL via RESPIRATORY_TRACT
  Filled 2016-08-10: qty 3

## 2016-08-10 MED ORDER — ALBUTEROL SULFATE (2.5 MG/3ML) 0.083% IN NEBU: 2.5000 mg | INHALATION_SOLUTION | Freq: Four times a day (QID) | RESPIRATORY_TRACT | Status: DC | PRN

## 2016-08-10 MED ORDER — ACETAMINOPHEN 650 MG RE SUPP: 650.0000 mg | Freq: Four times a day (QID) | RECTAL | Status: DC | PRN

## 2016-08-10 MED ORDER — LISINOPRIL 20 MG PO TABS
20.0000 mg | ORAL_TABLET | Freq: Every day | ORAL | Status: DC
  Administered 2016-08-10 – 2016-08-13 (×4): 20 mg via ORAL
  Filled 2016-08-10 (×4): qty 1

## 2016-08-10 MED ORDER — METHYLPREDNISOLONE SODIUM SUCC 125 MG IJ SOLR: 60.0000 mg | INTRAMUSCULAR | Status: DC

## 2016-08-10 MED ORDER — GABAPENTIN 400 MG PO CAPS
400.0000 mg | ORAL_CAPSULE | Freq: Three times a day (TID) | ORAL | Status: DC
  Administered 2016-08-10 – 2016-08-13 (×8): 400 mg via ORAL
  Filled 2016-08-10 (×8): qty 1

## 2016-08-10 MED ORDER — METHYLPREDNISOLONE SODIUM SUCC 125 MG IJ SOLR
125.0000 mg | Freq: Once | INTRAMUSCULAR | Status: AC
  Administered 2016-08-10: 125 mg via INTRAVENOUS
  Filled 2016-08-10: qty 2

## 2016-08-10 MED ORDER — TIOTROPIUM BROMIDE MONOHYDRATE 18 MCG IN CAPS
18.0000 ug | ORAL_CAPSULE | Freq: Every day | RESPIRATORY_TRACT | Status: DC
  Administered 2016-08-10 – 2016-08-13 (×3): 18 ug via RESPIRATORY_TRACT
  Filled 2016-08-10: qty 5

## 2016-08-10 MED ORDER — ALBUTEROL SULFATE (2.5 MG/3ML) 0.083% IN NEBU
INHALATION_SOLUTION | RESPIRATORY_TRACT | Status: AC
  Administered 2016-08-10: 5 mg via RESPIRATORY_TRACT
  Filled 2016-08-10: qty 3

## 2016-08-10 MED ORDER — ONDANSETRON HCL 4 MG/2ML IJ SOLN: 4.0000 mg | Freq: Four times a day (QID) | INTRAMUSCULAR | Status: DC | PRN

## 2016-08-10 MED ORDER — ONDANSETRON HCL 4 MG PO TABS: 4.0000 mg | ORAL_TABLET | Freq: Four times a day (QID) | ORAL | Status: DC | PRN

## 2016-08-10 MED ORDER — TRAMADOL HCL 50 MG PO TABS: 50.0000 mg | ORAL_TABLET | Freq: Four times a day (QID) | ORAL | Status: DC | PRN

## 2016-08-10 MED ORDER — ENOXAPARIN SODIUM 40 MG/0.4ML ~~LOC~~ SOLN
40.0000 mg | SUBCUTANEOUS | Status: DC
  Administered 2016-08-10 – 2016-08-12 (×3): 40 mg via SUBCUTANEOUS
  Filled 2016-08-10 (×3): qty 0.4

## 2016-08-10 NOTE — ED Notes (Signed)
Pt states cough and congestion x 2 weeks and SOB while just lying there. States she has takne OTC without relief. Pt coughing lying in bed, states she got up to go to the bathroom and couldn't breath. Pt on 2 L nasal cannula, oxygen levels 96-99%. Will continue to monitor.

## 2016-08-10 NOTE — ED Notes (Signed)
Admitting MD at bedside.  Pt ambulatory to toilet by self.

## 2016-08-10 NOTE — Progress Notes (Signed)
Patient was admitted to room 146 from ER. SBA, steady gait. A&O x4. On 2L O2, sats 92%. SOB with activity. Boyfriend at bedside. Reviewed POC and orders. NSL IV.

## 2016-08-10 NOTE — ED Provider Notes (Signed)
[facility] Emergency Department Provider Note  ____________________________________________   First MD Initiated Contact with Patient 08/10/16 1540     (approximate)  I have reviewed the triage vital signs and the nursing notes.   HISTORY  Chief Complaint Shortness of Breath  HPI Karen Russell is a 41 y.o. female who comes to the emergency department with 3-4 days of worsening shortness of breath and increased cough. She is a heavy smoker. She says she has never been diagnosed with COPD but she is afraid she might have it. She was prescribed an albuterol inhaler in the past although she has no insurance and has been unable to fill it. She did use a friend's inhaler which did not help. She denies fevers or chills. She denies abdominal pain nausea or vomiting.   Past Medical History:  Diagnosis Date  . Arthritis   . Fibromyalgia syndrome   . Hyperlipemia   . Hypertension     Patient Active Problem List   Diagnosis Date Noted  . Respiratory failure with hypoxia (HCC) 08/10/2016    Past Surgical History:  Procedure Laterality Date  . KIDNEY SURGERY    . TUBAL LIGATION      Prior to Admission medications   Medication Sig Start Date End Date Taking? Authorizing Provider  gabapentin (NEURONTIN) 400 MG capsule Take 400 mg by mouth 3 (three) times daily.   Yes Historical Provider, MD  ibuprofen (ADVIL,MOTRIN) 600 MG tablet Take 1 tablet (600 mg total) by mouth every 8 (eight) hours as needed. 04/24/16  Yes Joni Reiningonald K Smith, PA-C  lisinopril (PRINIVIL,ZESTRIL) 20 MG tablet Take 20 mg by mouth daily.   Yes Historical Provider, MD  simvastatin (ZOCOR) 20 MG tablet Take 20 mg by mouth daily.   Yes Historical Provider, MD  chlorpheniramine-HYDROcodone (TUSSIONEX PENNKINETIC ER) 10-8 MG/5ML SUER Take 5 mLs by mouth 2 (two) times daily. Patient not taking: Reported on 08/10/2016 04/24/16   Joni Reiningonald K Smith, PA-C    Allergies Patient has no known  allergies.  No family history on file.  Social History Social History  Substance Use Topics  . Smoking status: Current Every Day Smoker    Packs/day: 0.50    Types: Cigarettes  . Smokeless tobacco: Never Used  . Alcohol use No    Review of Systems Constitutional: No fever/chills Eyes: No visual changes. ENT: No sore throat. Cardiovascular: Positive chest pain. Respiratory: Positive shortness of breath. Gastrointestinal: No abdominal pain.  No nausea, no vomiting.  No diarrhea.  No constipation. Genitourinary: Negative for dysuria. Musculoskeletal: Negative for back pain. Skin: Negative for rash. Neurological: Negative for headaches, focal weakness or numbness.  10-point ROS otherwise negative.  ____________________________________________   PHYSICAL EXAM:  VITAL SIGNS: ED Triage Vitals  Enc Vitals Group     BP 08/10/16 1404 126/60     Pulse Rate 08/10/16 1404 94     Resp 08/10/16 1404 (!) 22     Temp 08/10/16 1404 98.1 F (36.7 C)     Temp Source 08/10/16 1404 Oral     SpO2 08/10/16 1404 (!) 87 %     Weight 08/10/16 1405 160 lb (72.6 kg)     Height 08/10/16 1405 5\' 2"  (1.575 m)     Head Circumference --      Peak Flow --      Pain Score --      Pain Loc --      Pain Edu? --      Excl. in GC? --  Constitutional: Alert and oriented x 4 Tearful. Short of breath using mild accessory muscles Eyes: PERRL EOMI. Head: Atraumatic. Nose: No congestion/rhinnorhea. Mouth/Throat: No trismus Neck: No stridor.   Cardiovascular: Normal rate, regular rhythm. Grossly normal heart sounds.  Good peripheral circulation. Respiratory: Increased respiratory rate using mild accessory muscles expiratory wheezes and crackles throughout moving limited amounts of air Gastrointestinal: Soft nondistended nontender no rebound no guarding no peritonitis no McBurney's tenderness negative Rovsing's no costovertebral tenderness negative Murphy's Musculoskeletal: No lower extremity edema    Neurologic:  Normal speech and language. No gross focal neurologic deficits are appreciated. Skin:  Skin is warm, dry and intact. No rash noted. Psychiatric: Mood and affect are normal. Speech and behavior are normal.  ____________________________________________   LABS (all labs ordered are listed, but only abnormal results are displayed)  Labs Reviewed  BASIC METABOLIC PANEL - Abnormal; Notable for the following:       Result Value   Glucose, Bld 126 (*)    Creatinine, Ser 0.42 (*)    All other components within normal limits  CBC - Abnormal; Notable for the following:    WBC 14.8 (*)    All other components within normal limits  TROPONIN I   ____________________________________________  EKG   ____________________________________________  RADIOLOGY  Dg Chest 2 View  Result Date: 08/10/2016 CLINICAL DATA:  Patient reports SOB onset last night. Denies CP. Hx asthma, COPD, HTN. Current smoker. EXAM: CHEST  2 VIEW COMPARISON:  04/24/2016 FINDINGS: Mild hyperinflation. Midline trachea. Normal heart size and mediastinal contours. Right lower lobe calcified granuloma. Left base scarring. No pleural effusion or pneumothorax. IMPRESSION: Hyperinflation, without acute disease. Electronically Signed   By: Jeronimo Greaves M.D.   On: 08/10/2016 15:02    ____________________________________________   PROCEDURES  Procedure(s) performed: no  Procedures  Critical Care performed: no  ____________________________________________   INITIAL IMPRESSION / ASSESSMENT AND PLAN / ED COURSE  Pertinent labs & imaging results that were available during my care of the patient were reviewed by me and considered in my medical decision making (see chart for details).  The patient arrives quite wheezy saturating 87% on room air. She feels somewhat improved after several albuterol nebulizations however I walked her without oxygen and following the walk her oxygen saturation was 88%. Concerning this  is a significant COPD exacerbation and she requires inpatient admission for Solu-Medrol, nebulizations, and antibiotics. I discussed the case with hospitalist Dr. Allena Katz who is graciously agreed to admit the patient to her service.      ____________________________________________   FINAL CLINICAL IMPRESSION(S) / ED DIAGNOSES  Final diagnoses:  COPD exacerbation (HCC)      NEW MEDICATIONS STARTED DURING THIS VISIT:  New Prescriptions   No medications on file     Note:  This document was prepared using Dragon voice recognition software and may include unintentional dictation errors.     Merrily Brittle, MD 08/10/16 [phone]

## 2016-08-10 NOTE — H&P (Signed)
[facility] - [place] at [facility]   PATIENT NAME: Karen Russell    MR#:  [id number]  DATE OF BIRTH:  1975/10/25  DATE OF ADMISSION:  08/10/2016  PRIMARY CARE PHYSICIAN: Evelene Croon, MD   REQUESTING/REFERRING PHYSICIAN: Dr Lamont Snowball  CHIEF COMPLAINT:   Increasing shortness of breath and wheezing for 2 days HISTORY OF PRESENT ILLNESS:  Karen Russell  is a 41 y.o. female with a known history of COPD/asthma with ongoing tobacco abuse, fibromyalgia, hypertension comes to the emergency room with increasing shortness of breath and wheezing chest tightness and cough with clear sputum. She was diagnosed with COPD exacerbation. Patient developed hypoxia sats dropped down in the 80s when she was ambulated on room air. She is being admitted with acute hypoxic respiratory failure secondary to COPD exacerbation. In the ER patient received several rounds of nebulization and IV Solu-Medrol she received of oral dose of by mouth Zithromax chest x-ray negative for pneumonia.  PAST MEDICAL HISTORY:   Past Medical History:  Diagnosis Date  . Arthritis   . Fibromyalgia syndrome   . Hyperlipemia   . Hypertension     PAST SURGICAL HISTOIRY:   Past Surgical History:  Procedure Laterality Date  . KIDNEY SURGERY    . TUBAL LIGATION      SOCIAL HISTORY:   Social History  Substance Use Topics  . Smoking status: Current Every Day Smoker    Packs/day: 0.50    Types: Cigarettes  . Smokeless tobacco: Never Used  . Alcohol use No    FAMILY HISTORY:  No family history on file.  DRUG ALLERGIES:  No Known Allergies  REVIEW OF SYSTEMS:  Review of Systems  Constitutional: Negative for chills, fever and weight loss.  HENT: Negative for ear discharge, ear pain and nosebleeds.   Eyes: Negative for blurred vision, pain and discharge.  Respiratory: Positive for cough, sputum production, shortness of breath and wheezing. Negative for stridor.   Cardiovascular: Negative  for chest pain, palpitations, orthopnea and PND.  Gastrointestinal: Negative for abdominal pain, diarrhea, nausea and vomiting.  Genitourinary: Negative for frequency and urgency.  Musculoskeletal: Negative for back pain and joint pain.  Neurological: Positive for weakness. Negative for sensory change, speech change and focal weakness.  Psychiatric/Behavioral: Negative for depression and hallucinations. The patient is not nervous/anxious.      MEDICATIONS AT HOME:   Prior to Admission medications   Medication Sig Start Date End Date Taking? Authorizing Provider  gabapentin (NEURONTIN) 400 MG capsule Take 400 mg by mouth 3 (three) times daily.   Yes Historical Provider, MD  ibuprofen (ADVIL,MOTRIN) 600 MG tablet Take 1 tablet (600 mg total) by mouth every 8 (eight) hours as needed. 04/24/16  Yes Joni Reining, PA-C  lisinopril (PRINIVIL,ZESTRIL) 20 MG tablet Take 20 mg by mouth daily.   Yes Historical Provider, MD  simvastatin (ZOCOR) 20 MG tablet Take 20 mg by mouth daily.   Yes Historical Provider, MD  chlorpheniramine-HYDROcodone (TUSSIONEX PENNKINETIC ER) 10-8 MG/5ML SUER Take 5 mLs by mouth 2 (two) times daily. Patient not taking: Reported on 08/10/2016 04/24/16   Joni Reining, PA-C      VITAL SIGNS:  Blood pressure 128/70, pulse (!) 108, temperature 98.1 F (36.7 C), temperature source Oral, resp. rate 20, height 5\' 2"  (1.575 m), weight 72.6 kg (160 lb), last menstrual period 08/03/2016, SpO2 96 %.  PHYSICAL EXAMINATION:  GENERAL:  41 y.o.-year-old patient lying in the bed with no acute distress.  EYES: Pupils equal, round,  reactive to light and accommodation. No scleral icterus. Extraocular muscles intact.  HEENT: Head atraumatic, normocephalic. Oropharynx and nasopharynx clear.  NECK:  Supple, no jugular venous distention. No thyroid enlargement, no tenderness.  LUNGS: Normal breath sounds bilaterally, ++ wheezing,no rales,rhonchi or crepitation. No use of accessory muscles of  respiration.  CARDIOVASCULAR: S1, S2 normal. No murmurs, rubs, or gallops.  ABDOMEN: Soft, nontender, nondistended. Bowel sounds present. No organomegaly or mass.  EXTREMITIES: No pedal edema, cyanosis, or clubbing.  NEUROLOGIC: Cranial nerves II through XII are intact. Muscle strength 5/5 in all extremities. Sensation intact. Gait not checked.  PSYCHIATRIC: patient is alert and oriented x 3.  SKIN: No obvious rash, lesion, or ulcer.   LABORATORY PANEL:   CBC  Recent Labs Lab 08/10/16 1418  WBC 14.8*  HGB 15.1  HCT 43.5  PLT 325   ------------------------------------------------------------------------------------------------------------------  Chemistries   Recent Labs Lab 08/10/16 1418  NA 136  K 4.5  CL 105  CO2 23  GLUCOSE 126*  BUN 8  CREATININE 0.42*  CALCIUM 9.2   ------------------------------------------------------------------------------------------------------------------  Cardiac Enzymes  Recent Labs Lab 08/10/16 1418  TROPONINI <0.03   ------------------------------------------------------------------------------------------------------------------  RADIOLOGY:  Dg Chest 2 View  Result Date: 08/10/2016 CLINICAL DATA:  Patient reports SOB onset last night. Denies CP. Hx asthma, COPD, HTN. Current smoker. EXAM: CHEST  2 VIEW COMPARISON:  04/24/2016 FINDINGS: Mild hyperinflation. Midline trachea. Normal heart size and mediastinal contours. Right lower lobe calcified granuloma. Left base scarring. No pleural effusion or pneumothorax. IMPRESSION: Hyperinflation, without acute disease. Electronically Signed   By: Jeronimo GreavesKyle  Talbot M.D.   On: 08/10/2016 15:02    EKG:    IMPRESSION AND PLAN:   [name]  is a 41 y.o. female with a known history of COPD/asthma with ongoing tobacco abuse, fibromyalgia, hypertension comes to the emergency room with increasing shortness of breath and wheezing chest tightness and cough with clear sputum. She was diagnosed with  COPD exacerbation.   1. Acute hypoxic respiratory failure secondary to COPD exacerbation in the setting of tobacco abuse heavy. -Admit to medical floor -IV Solu-Medrol, oral inhalers, nebulizer, Spiriva. -Empiric Zithromax -Continue oxygen weaned as tolerated  2. COPD exacerbation as above  3. Hypertension continue home meds  4. Hyperlipidemia on statins  5. Tobacco abuse. Smoking cessation advise more than 40 minutes spent. Nicotine patch applied  6. Fibromyalgia When necessary tramadol and Flexeril as needed  7. DVT prophylaxis subcutaneous Lovenox  All the records are reviewed and case discussed with ED provider. Management plans discussed with the patient, family and they are in agreement.  CODE STATUS: Full code TOTAL TIME TAKING CARE OF THIS PATIENT: 50 minutes.    Chiquetta Langner M.D on 08/10/2016 at 5:34 PM  Between 7am to 6pm - Pager - [phone]  After 6pm go to [email or web address] - password [email or web address] [facility]  [facility]  Office  [phone]  CC: Primary care physician; Evelene CroonNIEMEYER, MEINDERT, MD

## 2016-08-10 NOTE — ED Notes (Signed)
Pt gets coughing spells where she coughs so continuously that her HR goes up and oxygen drops to 91% on 2 L nasal cannula. When coughing subsides, HR goes back down and oxygen comes up slowly.

## 2016-08-10 NOTE — ED Notes (Signed)
Pt ambulated up and down hallway on room air with steady gait. Pt maintained oxygen levels until the end, dropped down to 91%. When she sat back down on bed oxygen dropped to 88% RA. Placed back on nasal cannula at 2 L. Will continue to monitor.

## 2016-08-10 NOTE — ED Triage Notes (Signed)
Pt complains of shortness of breath with cough and congestion, o2 sat on RA 87%, dyspnea with exertion, pt denies fever

## 2016-08-11 MED ORDER — GUAIFENESIN-CODEINE 100-10 MG/5ML PO SOLN
5.0000 mL | Freq: Four times a day (QID) | ORAL | Status: DC | PRN
  Administered 2016-08-12 – 2016-08-13 (×3): 5 mL via ORAL
  Filled 2016-08-11 (×3): qty 5

## 2016-08-11 MED ORDER — NICOTINE 21 MG/24HR TD PT24
21.0000 mg | MEDICATED_PATCH | Freq: Every day | TRANSDERMAL | Status: DC
  Administered 2016-08-11 – 2016-08-13 (×3): 21 mg via TRANSDERMAL
  Filled 2016-08-11 (×3): qty 1

## 2016-08-11 MED ORDER — METHYLPREDNISOLONE SODIUM SUCC 40 MG IJ SOLR
40.0000 mg | Freq: Two times a day (BID) | INTRAMUSCULAR | Status: DC
  Administered 2016-08-11 – 2016-08-12 (×3): 40 mg via INTRAVENOUS
  Filled 2016-08-11 (×3): qty 1

## 2016-08-11 MED ORDER — AZITHROMYCIN 250 MG PO TABS
250.0000 mg | ORAL_TABLET | Freq: Every day | ORAL | Status: DC
Start: 2016-08-12 — End: 2016-08-13
  Administered 2016-08-12 – 2016-08-13 (×2): 250 mg via ORAL
  Filled 2016-08-11 (×2): qty 1

## 2016-08-11 MED ORDER — BENZONATATE 100 MG PO CAPS
200.0000 mg | ORAL_CAPSULE | Freq: Three times a day (TID) | ORAL | Status: DC
  Administered 2016-08-11 – 2016-08-13 (×7): 200 mg via ORAL
  Filled 2016-08-11 (×7): qty 2

## 2016-08-11 MED ORDER — GUAIFENESIN-DM 100-10 MG/5ML PO SYRP
5.0000 mL | ORAL_SOLUTION | ORAL | Status: DC | PRN
  Administered 2016-08-11 – 2016-08-12 (×3): 5 mL via ORAL
  Filled 2016-08-11 (×3): qty 5

## 2016-08-11 MED ORDER — BUDESONIDE 0.25 MG/2ML IN SUSP
0.2500 mg | Freq: Two times a day (BID) | RESPIRATORY_TRACT | Status: DC
  Administered 2016-08-11 – 2016-08-13 (×4): 0.25 mg via RESPIRATORY_TRACT
  Filled 2016-08-11 (×4): qty 2

## 2016-08-11 MED ORDER — CODEINE SULFATE 30 MG PO TABS
15.0000 mg | ORAL_TABLET | Freq: Four times a day (QID) | ORAL | Status: DC | PRN
  Administered 2016-08-11 – 2016-08-12 (×3): 15 mg via ORAL
  Filled 2016-08-11 (×5): qty 1

## 2016-08-11 MED ORDER — TRAMADOL HCL 50 MG PO TABS
50.0000 mg | ORAL_TABLET | Freq: Four times a day (QID) | ORAL | Status: DC | PRN
  Administered 2016-08-12: 50 mg via ORAL
  Filled 2016-08-11: qty 1

## 2016-08-11 MED ORDER — IPRATROPIUM-ALBUTEROL 0.5-2.5 (3) MG/3ML IN SOLN
3.0000 mL | RESPIRATORY_TRACT | Status: DC
  Administered 2016-08-11 – 2016-08-13 (×14): 3 mL via RESPIRATORY_TRACT
  Filled 2016-08-11 (×15): qty 3

## 2016-08-11 NOTE — Progress Notes (Signed)
Pt. C/o pain while coughing. Dr. Winona LegatoVaickute ordered codeine 15mg  PO q6h prn

## 2016-08-11 NOTE — Progress Notes (Signed)
[facility] - [place] at [facility]   PATIENT NAME: Karen HeirKaren Raudenbush    MR#:  [id number]  DATE OF BIRTH:  07-17-1975  SUBJECTIVE:  Feeling sob and has wheezing not much improvement since admission  REVIEW OF SYSTEMS:    Review of Systems  Constitutional: Negative for chills, fever and malaise/fatigue.  HENT: Negative.  Negative for ear discharge, ear pain, hearing loss, nosebleeds and sore throat.   Eyes: Negative.  Negative for blurred vision and pain.  Respiratory: Positive for cough, shortness of breath and wheezing. Negative for hemoptysis.   Cardiovascular: Negative.  Negative for chest pain, palpitations and leg swelling.  Gastrointestinal: Negative.  Negative for abdominal pain, blood in stool, diarrhea, nausea and vomiting.  Genitourinary: Negative.  Negative for dysuria.  Musculoskeletal: Negative.  Negative for back pain.  Skin: Negative.   Neurological: Positive for weakness and headaches. Negative for dizziness, tremors, speech change, focal weakness and seizures.  Endo/Heme/Allergies: Negative.  Does not bruise/bleed easily.  Psychiatric/Behavioral: Negative.  Negative for depression, hallucinations and suicidal ideas.    Tolerating Diet:yes      DRUG ALLERGIES:  No Known Allergies  VITALS:  Blood pressure 111/62, pulse (!) 110, temperature 98.5 F (36.9 C), temperature source Oral, resp. rate 20, height 5\' 2"  (1.575 m), weight 73 kg (160 lb 14.4 oz), last menstrual period 08/03/2016, SpO2 95 %.  PHYSICAL EXAMINATION:   Physical Exam  Constitutional: She is oriented to person, place, and time and well-developed, well-nourished, and in no distress. No distress.  HENT:  Head: Normocephalic.  Eyes: No scleral icterus.  Neck: Normal range of motion. Neck supple. No JVD present. No tracheal deviation present.  Cardiovascular: Normal rate, regular rhythm and normal heart sounds.  Exam reveals no gallop and no friction rub.   No murmur  heard. Pulmonary/Chest: She is in respiratory distress (mild). She has wheezes. She has no rales. She exhibits no tenderness.  Abdominal: Soft. Bowel sounds are normal. She exhibits no distension and no mass. There is no tenderness. There is no rebound and no guarding.  Musculoskeletal: Normal range of motion. She exhibits no edema.  Neurological: She is alert and oriented to person, place, and time.  Skin: Skin is warm. No rash noted. No erythema.  Psychiatric: Affect and judgment normal.      LABORATORY PANEL:   CBC  Recent Labs Lab 08/10/16 1418  WBC 14.8*  HGB 15.1  HCT 43.5  PLT 325   ------------------------------------------------------------------------------------------------------------------  Chemistries   Recent Labs Lab 08/10/16 1418  NA 136  K 4.5  CL 105  CO2 23  GLUCOSE 126*  BUN 8  CREATININE 0.42*  CALCIUM 9.2   ------------------------------------------------------------------------------------------------------------------  Cardiac Enzymes  Recent Labs Lab 08/10/16 1418  TROPONINI <0.03   ------------------------------------------------------------------------------------------------------------------  RADIOLOGY:  Dg Chest 2 View  Result Date: 08/10/2016 CLINICAL DATA:  Patient reports SOB onset last night. Denies CP. Hx asthma, COPD, HTN. Current smoker. EXAM: CHEST  2 VIEW COMPARISON:  04/24/2016 FINDINGS: Mild hyperinflation. Midline trachea. Normal heart size and mediastinal contours. Right lower lobe calcified granuloma. Left base scarring. No pleural effusion or pneumothorax. IMPRESSION: Hyperinflation, without acute disease. Electronically Signed   By: Jeronimo GreavesKyle  Talbot M.D.   On: 08/10/2016 15:02     ASSESSMENT AND PLAN:   41 y.o. female with a known history of COPD/asthma with ongoing tobacco abuse, fibromyalgia, hypertension presents with  shortness of breath and wheezing.   1. Acute hypoxic respiratory failure due to COPD  exacerbation in the setting  of ongoing tobacco abuse. Wean O2 as tolerated COPD as outlined below.   2. Acute COPD exacerbation: Increase IV steroids to BID Continue inhalers and Nebs Add cough suppresant Axithromycin for acute bronchitis related to COPD flare.   3.Essential Hypertension: continue Lisinopril  4. Hyperlipidemia; Continue Simvastatin 5. Tobacco abuse: counselled 6. Fibromyalgia:Continue PRN Tramadol and Flexeril.     Management plans discussed with the patient and she is in agreement.  CODE STATUS: full  TOTAL TIME TAKING CARE OF THIS PATIENT: 30 minutes.     POSSIBLE D/C 2 days, DEPENDING ON CLINICAL CONDITION.   Quinnten Calvin M.D on 08/11/2016 at 9:39 AM  Between 7am to 6pm - Pager - [phone] After 6pm go to [email or web address] - password [email or web address]  [facility] [place] [facility]  Office  [phone]  CC: Primary care physician; Evelene Croon, MD  Note: This dictation was prepared with Dragon dictation along with smaller phrase technology. Any transcriptional errors that result from this process are unintentional.

## 2016-08-11 NOTE — Progress Notes (Signed)
Patient is A&O x4. Up with SBA. Desats with activity. On 2L O2. Exp. Wheezes. PRN meds given as needed.

## 2016-08-11 NOTE — Care Management Note (Signed)
Case Management Note  Patient Details  Name: Karen Russell MRN: [id number] Date of Birth: 1975/09/24  Subjective/Objective:   Met with patient to discuss discharge planning. Patient lives at home with her boyfriend. She uses no DME. Trying to get on disability for her COPD and Fibromyalgia. She states she hs the medicaid " that only pays for pap smears."  Has a PCP but has not been in a long time due to lack of insurance. (Dr. Bernita Buffy). Application given for medication management and [facility]. Referral sent to both clinics.  May need MATCH if discharges over the weekend.               Action/Plan:   Expected Discharge Date:  08/12/16               Expected Discharge Plan:  Home/Self Care  In-House Referral:     Discharge planning Services  CM Consult, [facility], Medication Assistance  Post Acute Care Choice:    Choice offered to:  Patient  DME Arranged:    DME Agency:     HH Arranged:    [place] Agency:     Status of Service:  Completed, signed off  If discussed at [employer], dates discussed:    Additional Comments:  Jolly Mango, RN 08/11/2016, 2:08 PM

## 2016-08-12 MED ORDER — METHYLPREDNISOLONE SODIUM SUCC 40 MG IJ SOLR
20.0000 mg | Freq: Two times a day (BID) | INTRAMUSCULAR | Status: DC
  Administered 2016-08-12 – 2016-08-13 (×2): 20 mg via INTRAVENOUS
  Filled 2016-08-12 (×2): qty 1

## 2016-08-12 MED ORDER — GUAIFENESIN ER 600 MG PO TB12
600.0000 mg | ORAL_TABLET | Freq: Two times a day (BID) | ORAL | Status: DC
  Administered 2016-08-12 – 2016-08-13 (×3): 600 mg via ORAL
  Filled 2016-08-12 (×3): qty 1

## 2016-08-12 NOTE — Progress Notes (Signed)
Patients oxygen saturation on room air was 88%. Pt asked to deep breath and cough, saturation went up briefly then dropped back to 88%. Neb treatment given and patient placed back on 2 liter [place]. Ree KidaJack RN notified of change.

## 2016-08-12 NOTE — Progress Notes (Signed)
[facility] - [place] at [facility]   PATIENT NAME: Karen HeirKaren Marrs    MR#:  [id number]  DATE OF BIRTH:  [birth year]  SUBJECTIVE:  Feeling better this am  But still with cough and sob but improved  REVIEW OF SYSTEMS:    Review of Systems  Constitutional: Negative for chills, fever and malaise/fatigue.  HENT: Negative.  Negative for ear discharge, ear pain, hearing loss, nosebleeds and sore throat.   Eyes: Negative.  Negative for blurred vision and pain.  Respiratory: Positive for cough, shortness of breath and wheezing. Negative for hemoptysis.   Cardiovascular: Negative.  Negative for chest pain, palpitations and leg swelling.  Gastrointestinal: Negative.  Negative for abdominal pain, blood in stool, diarrhea, nausea and vomiting.  Genitourinary: Negative.  Negative for dysuria.  Musculoskeletal: Negative.  Negative for back pain.  Skin: Negative.   Neurological: Positive for weakness. Negative for dizziness, tremors, speech change, focal weakness and seizures.  Endo/Heme/Allergies: Negative.  Does not bruise/bleed easily.  Psychiatric/Behavioral: Negative.  Negative for depression, hallucinations and suicidal ideas.    Tolerating Diet:yes      DRUG ALLERGIES:  No Known Allergies  VITALS:  Blood pressure 116/70, pulse 100, temperature 97.1 F (36.2 C), temperature source Axillary, resp. rate (!) 22, height 5\' 2"  (1.575 m), weight 73 kg (160 lb 14.4 oz), last menstrual period 08/03/2016, SpO2 93 %.  PHYSICAL EXAMINATION:   Physical Exam  Constitutional: She is oriented to person, place, and time and well-developed, well-nourished, and in no distress. No distress.  HENT:  Head: Normocephalic.  Eyes: No scleral icterus.  Neck: Normal range of motion. Neck supple. No JVD present. No tracheal deviation present.  Cardiovascular: Normal rate, regular rhythm and normal heart sounds.  Exam reveals no gallop and no friction rub.   No murmur  heard. Pulmonary/Chest: She has wheezes. She has no rales. She exhibits no tenderness.  Abdominal: Soft. Bowel sounds are normal. She exhibits no distension and no mass. There is no tenderness. There is no rebound and no guarding.  Musculoskeletal: Normal range of motion. She exhibits no edema.  Neurological: She is alert and oriented to person, place, and time.  Skin: Skin is warm. No rash noted. No erythema.  Psychiatric: Affect and judgment normal.      LABORATORY PANEL:   CBC  Recent Labs Lab 08/10/16 1418  WBC 14.8*  HGB 15.1  HCT 43.5  PLT 325   ------------------------------------------------------------------------------------------------------------------  Chemistries   Recent Labs Lab 08/10/16 1418  NA 136  K 4.5  CL 105  CO2 23  GLUCOSE 126*  BUN 8  CREATININE 0.42*  CALCIUM 9.2   ------------------------------------------------------------------------------------------------------------------  Cardiac Enzymes  Recent Labs Lab 08/10/16 1418  TROPONINI <0.03   ------------------------------------------------------------------------------------------------------------------  RADIOLOGY:  Dg Chest 2 View  Result Date: 08/10/2016 CLINICAL DATA:  Patient reports SOB onset last night. Denies CP. Hx asthma, COPD, HTN. Current smoker. EXAM: CHEST  2 VIEW COMPARISON:  04/24/2016 FINDINGS: Mild hyperinflation. Midline trachea. Normal heart size and mediastinal contours. Right lower lobe calcified granuloma. Left base scarring. No pleural effusion or pneumothorax. IMPRESSION: Hyperinflation, without acute disease. Electronically Signed   By: Jeronimo GreavesKyle  Talbot M.D.   On: 08/10/2016 15:02     ASSESSMENT AND PLAN:   41 y.o. female with a known history of COPD/asthma with ongoing tobacco abuse, fibromyalgia, hypertension presents with  shortness of breath and wheezing.   1. Acute hypoxic respiratory failure due to COPD exacerbation in the setting of ongoing tobacco  abuse. She  currently 94% on room air   COPD as outlined below.   2. Acute COPD exacerbation: Wean IV steroids. Continue inhalers and Nebs Continue cough suppresant and Mucinex Azithromycin for acute bronchitis related to COPD flare. Day 3 of 5   3.Essential Hypertension: continue Lisinopril  4. Hyperlipidemia; Continue Simvastatin 5. Tobacco abuse: counselled 6. Fibromyalgia:Continue PRN Tramadol and Flexeril.     Management plans discussed with the patient and she is in agreement.  CODE STATUS: full  TOTAL TIME TAKING CARE OF THIS PATIENT: 22 minutes.     POSSIBLE D/C tomorrow, DEPENDING ON CLINICAL CONDITION.   Quynn Vilchis M.D on 08/12/2016 at 10:56 AM  Between 7am to 6pm - Pager - [phone] After 6pm go to [email or web address] - password [email or web address]  [facility] [place] [facility]  Office  [phone]  CC: Primary care physician; Evelene Croon, MD  Note: This dictation was prepared with Dragon dictation along with smaller phrase technology. Any transcriptional errors that result from this process are unintentional.

## 2016-08-13 MED ORDER — GUAIFENESIN-CODEINE 100-10 MG/5ML PO SOLN: 5.0000 mL | Freq: Four times a day (QID) | ORAL | 0 refills | Status: DC | PRN

## 2016-08-13 MED ORDER — PREDNISONE 10 MG PO TABS: 10.0000 mg | ORAL_TABLET | Freq: Every day | ORAL | 0 refills | Status: DC

## 2016-08-13 MED ORDER — AZITHROMYCIN 250 MG PO TABS: ORAL_TABLET | ORAL | 0 refills | Status: DC

## 2016-08-13 MED ORDER — NICOTINE 21 MG/24HR TD PT24: 21.0000 mg | MEDICATED_PATCH | Freq: Every day | TRANSDERMAL | 0 refills | Status: DC

## 2016-08-13 MED ORDER — PANTOPRAZOLE SODIUM 40 MG PO TBEC: 40.0000 mg | DELAYED_RELEASE_TABLET | Freq: Every day | ORAL | 0 refills | Status: DC

## 2016-08-13 MED ORDER — ALBUTEROL SULFATE (2.5 MG/3ML) 0.083% IN NEBU: 2.5000 mg | INHALATION_SOLUTION | Freq: Four times a day (QID) | RESPIRATORY_TRACT | 12 refills | Status: DC | PRN

## 2016-08-13 MED ORDER — TIOTROPIUM BROMIDE MONOHYDRATE 18 MCG IN CAPS: 18.0000 ug | ORAL_CAPSULE | Freq: Every day | RESPIRATORY_TRACT | 0 refills | Status: DC

## 2016-08-13 MED ORDER — BENZONATATE 200 MG PO CAPS: 200.0000 mg | ORAL_CAPSULE | Freq: Three times a day (TID) | ORAL | 0 refills | Status: DC

## 2016-08-13 NOTE — Care Management Note (Signed)
Case Management Note  Patient Details  Name: Mertha FindersKaren B Bille MRN: [id number] Date of Birth: 02-20-1976  Subjective/Objective:    Uninsured Ms Mitchell HeirKaren Crunkleton qualified for new home oxygen this morning A referral for charity 02 and a nebulizer machine was called to [facility] at [facility]. [facility] and then set up new home oxygen at Ms Lobo's residence. [facility] will also deliver a home use nebulizer machine to [place] today.               Action/Plan:   Expected Discharge Date:  08/13/16               Expected Discharge Plan:  Home/Self Care  In-House Referral:     Discharge planning Services  CM Consult, [facility], Medication Assistance  Post Acute Care Choice:    Choice offered to:  Patient  DME Arranged:    DME Agency:     HH Arranged:    HH Agency:     Status of Service:  Completed, signed off  If discussed at [employer] Meetings, dates discussed:    Additional Comments:  Maayan Jenning A, RN 08/13/2016, 11:08 AM

## 2016-08-13 NOTE — Progress Notes (Signed)
SATURATION QUALIFICATIONS: (This note is used to comply with regulatory documentation for home oxygen)  Patient Saturations on Room Air at Rest = 88%  Patient Saturations on Room Air while Ambulating = 88%  Patient Saturations on 2 Liters of oxygen while Ambulating = 92%  Please briefly explain why patient needs home oxygen:

## 2016-08-13 NOTE — Progress Notes (Signed)
O2 weaned to 2 L.  Pt instructed in use of flutter valve

## 2016-08-13 NOTE — Discharge Summary (Signed)
[facility] - [place] at [facility]   PATIENT NAME: Karen Russell    MR#:  [id number]  DATE OF BIRTH:  09/25/1975  DATE OF ADMISSION:  08/10/2016 ADMITTING PHYSICIAN: Enedina Finner, MD  DATE OF DISCHARGE: 08/13/2016  PRIMARY CARE PHYSICIAN: Evelene Croon, MD    ADMISSION DIAGNOSIS:  COPD exacerbation (HCC) [J44.1]  DISCHARGE DIAGNOSIS:  Active Problems:   Respiratory failure with hypoxia (HCC)   SECONDARY DIAGNOSIS:   Past Medical History:  Diagnosis Date  . Arthritis   . Fibromyalgia syndrome   . Hyperlipemia   . Hypertension     HOSPITAL COURSE:   41 y.o. femalewith a known history of COPD/asthma with ongoing tobacco abuse, fibromyalgia, hypertension presents with  shortness of breath and wheezing.   1. Acute hypoxic respiratory failure due to COPD exacerbation in the setting of ongoing tobacco abuse.   2. Acute COPD/Asthma exacerbation:Patient's symptoms have significantly improved since admission. She continues to have wheezing on examination however she has good airflow. She will require oxygen at discharge. She will continue on prednisone taper. She will be discharged with nebulizer and inhaler. [facility]  3.Essential Hypertension: continue Lisinopril  4. Hyperlipidemia; Continue Simvastatin 5. Tobacco abuse: counselled 6. Fibromyalgia:Continue PRN Tramadol and Flexeril.  7. GERD: Patient is started on PPI DISCHARGE CONDITIONS AND DIET:   Stable for discharge on heart healthy diet  CONSULTS OBTAINED:    DRUG ALLERGIES:  No Known Allergies  DISCHARGE MEDICATIONS:   Current Discharge Medication List    START taking these medications   Details  albuterol (PROVENTIL) (2.5 MG/3ML) 0.083% nebulizer solution Inhale 3 mLs (2.5 mg total) into the lungs every 6 (six) hours as needed for wheezing or shortness of breath. Qty: 75 mL, Refills: 12    azithromycin (ZITHROMAX) 250 MG tablet  Take 1 tablet for 4 days Qty: 4 each, Refills: 0    benzonatate (TESSALON) 200 MG capsule Take 1 capsule (200 mg total) by mouth 3 (three) times daily. Qty: 20 capsule, Refills: 0    guaiFENesin-codeine 100-10 MG/5ML syrup Take 5 mLs by mouth every 6 (six) hours as needed for cough. Qty: 118 mL, Refills: 0    nicotine (NICODERM CQ - DOSED IN MG/24 HOURS) 21 mg/24hr patch Place 1 patch (21 mg total) onto the skin daily. Qty: 28 patch, Refills: 0    pantoprazole (PROTONIX) 40 MG tablet Take 1 tablet (40 mg total) by mouth daily. Qty: 30 tablet, Refills: 0    predniSONE (DELTASONE) 10 MG tablet Take 1 tablet (10 mg total) by mouth daily with breakfast. 60 mg PO (ORAL) x 2 days 50 mg PO (ORAL)  x 2 days 40 mg PO (ORAL)  x 2 days 30 mg PO  (ORAL)  x 2 days 20 mg PO  (ORAL) x 2 days 10 mg PO  (ORAL) x 2 days then stop Qty: 42 tablet, Refills: 0    tiotropium (SPIRIVA) 18 MCG inhalation capsule Place 1 capsule (18 mcg total) into inhaler and inhale daily. Qty: 30 capsule, Refills: 0      CONTINUE these medications which have NOT CHANGED   Details  gabapentin (NEURONTIN) 400 MG capsule Take 400 mg by mouth 3 (three) times daily.    lisinopril (PRINIVIL,ZESTRIL) 20 MG tablet Take 20 mg by mouth daily.    simvastatin (ZOCOR) 20 MG tablet Take 20 mg by mouth daily.      STOP taking these medications     ibuprofen (ADVIL,MOTRIN)  600 MG tablet      chlorpheniramine-HYDROcodone (TUSSIONEX PENNKINETIC ER) 10-8 MG/5ML SUER           Today   CHIEF COMPLAINT:  Shortness of breath and cough has improved   VITAL SIGNS:  Blood pressure 121/73, pulse 99, temperature 98.2 F (36.8 C), temperature source Oral, resp. rate 19, height 5\' 2"  (1.575 m), weight 73 kg (160 lb 14.4 oz), last menstrual period 08/03/2016, SpO2 95 %.   REVIEW OF SYSTEMS:  Review of Systems  Constitutional: Negative.  Negative for chills, fever and malaise/fatigue.  HENT: Negative.  Negative for ear  discharge, ear pain, hearing loss, nosebleeds and sore throat.   Eyes: Negative.  Negative for blurred vision and pain.  Respiratory: Positive for cough, shortness of breath (improved) and wheezing. Negative for hemoptysis.   Cardiovascular: Negative.  Negative for chest pain, palpitations and leg swelling.  Gastrointestinal: Negative.  Negative for abdominal pain, blood in stool, diarrhea, nausea and vomiting.  Genitourinary: Negative.  Negative for dysuria.  Musculoskeletal: Negative.  Negative for back pain.  Skin: Negative.   Neurological: Negative for dizziness, tremors, speech change, focal weakness, seizures and headaches.  Endo/Heme/Allergies: Negative.  Does not bruise/bleed easily.  Psychiatric/Behavioral: Negative.  Negative for depression, hallucinations and suicidal ideas.     PHYSICAL EXAMINATION:  GENERAL:  41 y.o.-year-old patient lying in the bed with no acute distress.  NECK:  Supple, no jugular venous distention. No thyroid enlargement, no tenderness.  LUNGS: Good airflow with bilateral wheezing no rhonchi or rales. No increased work of breathing or respiratory distress CARDIOVASCULAR: S1, S2 normal. No murmurs, rubs, or gallops.  ABDOMEN: Soft, non-tender, non-distended. Bowel sounds present. No organomegaly or mass.  EXTREMITIES: No pedal edema, cyanosis, or clubbing.  PSYCHIATRIC: The patient is alert and oriented x 3.  SKIN: No obvious rash, lesion, or ulcer.   DATA REVIEW:   CBC  Recent Labs Lab 08/10/16 1418  WBC 14.8*  HGB 15.1  HCT 43.5  PLT 325    Chemistries   Recent Labs Lab 08/10/16 1418  NA 136  K 4.5  CL 105  CO2 23  GLUCOSE 126*  BUN 8  CREATININE 0.42*  CALCIUM 9.2    Cardiac Enzymes  Recent Labs Lab 08/10/16 1418  TROPONINI <0.03    Microbiology Results  @MICRORSLT48 @  RADIOLOGY:  No results found.    Current Discharge Medication List    START taking these medications   Details  albuterol (PROVENTIL) (2.5  MG/3ML) 0.083% nebulizer solution Inhale 3 mLs (2.5 mg total) into the lungs every 6 (six) hours as needed for wheezing or shortness of breath. Qty: 75 mL, Refills: 12    azithromycin (ZITHROMAX) 250 MG tablet Take 1 tablet for 4 days Qty: 4 each, Refills: 0    benzonatate (TESSALON) 200 MG capsule Take 1 capsule (200 mg total) by mouth 3 (three) times daily. Qty: 20 capsule, Refills: 0    guaiFENesin-codeine 100-10 MG/5ML syrup Take 5 mLs by mouth every 6 (six) hours as needed for cough. Qty: 118 mL, Refills: 0    nicotine (NICODERM CQ - DOSED IN MG/24 HOURS) 21 mg/24hr patch Place 1 patch (21 mg total) onto the skin daily. Qty: 28 patch, Refills: 0    pantoprazole (PROTONIX) 40 MG tablet Take 1 tablet (40 mg total) by mouth daily. Qty: 30 tablet, Refills: 0    predniSONE (DELTASONE) 10 MG tablet Take 1 tablet (10 mg total) by mouth daily with breakfast. 60 mg PO (ORAL) x  2 days 50 mg PO (ORAL)  x 2 days 40 mg PO (ORAL)  x 2 days 30 mg PO  (ORAL)  x 2 days 20 mg PO  (ORAL) x 2 days 10 mg PO  (ORAL) x 2 days then stop Qty: 42 tablet, Refills: 0    tiotropium (SPIRIVA) 18 MCG inhalation capsule Place 1 capsule (18 mcg total) into inhaler and inhale daily. Qty: 30 capsule, Refills: 0      CONTINUE these medications which have NOT CHANGED   Details  gabapentin (NEURONTIN) 400 MG capsule Take 400 mg by mouth 3 (three) times daily.    lisinopril (PRINIVIL,ZESTRIL) 20 MG tablet Take 20 mg by mouth daily.    simvastatin (ZOCOR) 20 MG tablet Take 20 mg by mouth daily.      STOP taking these medications     ibuprofen (ADVIL,MOTRIN) 600 MG tablet      chlorpheniramine-HYDROcodone (TUSSIONEX PENNKINETIC ER) 10-8 MG/5ML SUER            Management plans discussed with the patient and she is in agreement. Stable for discharge home  [facility]  CODE STATUS:     Code Status Orders        Start     Ordered   08/10/16 1820  Full code   Continuous     08/10/16 1821    Code Status History    Date Active Date Inactive Code Status Order ID Comments User Context   This patient has a current code status but no historical code status.      TOTAL TIME TAKING CARE OF THIS PATIENT: 37 minutes.    Note: This dictation was prepared with Dragon dictation along with smaller phrase technology. Any transcriptional errors that result from this process are unintentional.  Eutimio Gharibian M.D on 08/13/2016 at 10:28 AM  Between 7am to 6pm - Pager - [phone] After 6pm go to [email or web address] - Social research officer, governmentpassword [email or web address] [email or web address]  [facility]  Office  [phone]  CC: Primary care physician; Evelene CroonNIEMEYER, MEINDERT, MD

## 2016-08-14 NOTE — Progress Notes (Signed)
[facility]  Patient Status: unable to reach patient since d/c 3/11, driver called on the way to do a driver by to deliver home o2. Patient told him not to come as she does not have a place to live right now. Patient stated she would call back in couple days when she has some place to live. Informed FiservLynn Rockett, CM and Gieselman SiadAngela Johnson, CM.    Dimple CaseyJason E Hinton 08/14/2016, 2:59 PM

## 2016-09-07 ENCOUNTER — Ambulatory Visit: Payer: Medicaid Other | Admitting: Pharmacy Technician

## 2016-09-07 NOTE — Progress Notes (Signed)
Patient scheduled for eligibility appointment at [facility].  Patient did not show for the appointment on 09/07/16 at 9:00a.m.  Patient did not reschedule eligibility appointment.  [facility] will be unable to provide ongoing medication assistance until eligibility is determined.  Sherilyn Dacosta Care Manager [facility]

## 2016-12-14 ENCOUNTER — Emergency Department: Payer: Medicaid Other

## 2016-12-14 ENCOUNTER — Other Ambulatory Visit: Payer: Self-pay

## 2016-12-14 ENCOUNTER — Encounter: Payer: Self-pay | Admitting: *Deleted

## 2016-12-14 ENCOUNTER — Emergency Department
Admission: EM | Admit: 2016-12-14 | Discharge: 2016-12-14 | Payer: Medicaid Other | Attending: Student in an Organized Health Care Education/Training Program | Admitting: Student in an Organized Health Care Education/Training Program

## 2016-12-14 DIAGNOSIS — J209 Acute bronchitis, unspecified: Secondary | ICD-10-CM

## 2016-12-14 DIAGNOSIS — Z79899 Other long term (current) drug therapy: Secondary | ICD-10-CM | POA: Insufficient documentation

## 2016-12-14 DIAGNOSIS — Z8709 Personal history of other diseases of the respiratory system: Secondary | ICD-10-CM | POA: Insufficient documentation

## 2016-12-14 DIAGNOSIS — J440 Chronic obstructive pulmonary disease with acute lower respiratory infection: Secondary | ICD-10-CM | POA: Insufficient documentation

## 2016-12-14 DIAGNOSIS — I10 Essential (primary) hypertension: Secondary | ICD-10-CM | POA: Insufficient documentation

## 2016-12-14 DIAGNOSIS — Z7952 Long term (current) use of systemic steroids: Secondary | ICD-10-CM | POA: Insufficient documentation

## 2016-12-14 DIAGNOSIS — Z7951 Long term (current) use of inhaled steroids: Secondary | ICD-10-CM | POA: Insufficient documentation

## 2016-12-14 DIAGNOSIS — F17210 Nicotine dependence, cigarettes, uncomplicated: Secondary | ICD-10-CM | POA: Insufficient documentation

## 2016-12-14 LAB — CBC
HEMATOCRIT: 46.2 % (ref 35.0–47.0)
HEMOGLOBIN: 15.9 g/dL (ref 12.0–16.0)
MCH: 31.5 pg (ref 26.0–34.0)
MCHC: 34.5 g/dL (ref 32.0–36.0)
MCV: 91.4 fL (ref 80.0–100.0)
Platelets: 317 10*3/uL (ref 150–440)
RBC: 5.05 MIL/uL (ref 3.80–5.20)
RDW: 13.7 % (ref 11.5–14.5)
WBC: 10.7 10*3/uL (ref 3.6–11.0)

## 2016-12-14 LAB — BASIC METABOLIC PANEL
ANION GAP: 9 (ref 5–15)
BUN: 9 mg/dL (ref 6–20)
CHLORIDE: 104 mmol/L (ref 101–111)
CO2: 25 mmol/L (ref 22–32)
Calcium: 9.6 mg/dL (ref 8.9–10.3)
Creatinine, Ser: 0.8 mg/dL (ref 0.44–1.00)
GFR calc Af Amer: >60 mL/min (ref >60)
Glucose, Bld: 89 mg/dL (ref 65–99)
POTASSIUM: 4.5 mmol/L (ref 3.5–5.1)
SODIUM: 138 mmol/L (ref 135–145)

## 2016-12-14 LAB — TROPONIN I: Troponin I: <0.03 ng/mL (ref <0.03)

## 2016-12-14 MED ORDER — PREDNISONE 20 MG PO TABS: 40.0000 mg | ORAL_TABLET | Freq: Every day | ORAL | 0 refills | Status: AC

## 2016-12-14 MED ORDER — IPRATROPIUM-ALBUTEROL 0.5-2.5 (3) MG/3ML IN SOLN
3.0000 mL | Freq: Once | RESPIRATORY_TRACT | Status: AC
  Administered 2016-12-14: 3 mL via RESPIRATORY_TRACT
  Filled 2016-12-14: qty 3
  Filled 2016-12-14: qty 6

## 2016-12-14 MED ORDER — IPRATROPIUM-ALBUTEROL 0.5-2.5 (3) MG/3ML IN SOLN
3.0000 mL | Freq: Once | RESPIRATORY_TRACT | Status: AC
  Administered 2016-12-14: 3 mL via RESPIRATORY_TRACT
  Filled 2016-12-14: qty 3

## 2016-12-14 MED ORDER — KETOROLAC TROMETHAMINE 30 MG/ML IJ SOLN
15.0000 mg | Freq: Once | INTRAMUSCULAR | Status: AC
  Administered 2016-12-14: 15 mg via INTRAVENOUS

## 2016-12-14 MED ORDER — DOXYCYCLINE HYCLATE 50 MG PO CAPS: 100.0000 mg | ORAL_CAPSULE | Freq: Two times a day (BID) | ORAL | 0 refills | Status: AC

## 2016-12-14 MED ORDER — ALBUTEROL SULFATE (2.5 MG/3ML) 0.083% IN NEBU
INHALATION_SOLUTION | RESPIRATORY_TRACT | Status: AC
  Filled 2016-12-14: qty 3

## 2016-12-14 MED ORDER — METHYLPREDNISOLONE SODIUM SUCC 125 MG IJ SOLR
125.0000 mg | Freq: Once | INTRAMUSCULAR | Status: AC
  Administered 2016-12-14: 125 mg via INTRAVENOUS
  Filled 2016-12-14: qty 2

## 2016-12-14 MED ORDER — ALBUTEROL SULFATE HFA 108 (90 BASE) MCG/ACT IN AERS
2.0000 | INHALATION_SPRAY | Freq: Once | RESPIRATORY_TRACT | Status: AC
  Administered 2016-12-14: 2 via RESPIRATORY_TRACT
  Filled 2016-12-14: qty 6.7

## 2016-12-14 MED ORDER — OPTICHAMBER DIAMOND MISC
1.0000 | Freq: Once | Status: AC
  Administered 2016-12-14: 1
  Filled 2016-12-14: qty 1

## 2016-12-14 MED ORDER — KETOROLAC TROMETHAMINE 30 MG/ML IJ SOLN
INTRAMUSCULAR | Status: AC
  Administered 2016-12-14: 15 mg via INTRAVENOUS
  Filled 2016-12-14: qty 1

## 2016-12-14 MED ORDER — MAGNESIUM SULFATE 2 GM/50ML IV SOLN
INTRAVENOUS | Status: AC
  Administered 2016-12-14: 2 g via INTRAVENOUS
  Filled 2016-12-14: qty 50

## 2016-12-14 MED ORDER — ALBUTEROL SULFATE (2.5 MG/3ML) 0.083% IN NEBU
5.0000 mg | INHALATION_SOLUTION | Freq: Once | RESPIRATORY_TRACT | Status: AC
  Administered 2016-12-14: 5 mg via RESPIRATORY_TRACT
  Filled 2016-12-14: qty 6

## 2016-12-14 MED ORDER — DOXYCYCLINE HYCLATE 100 MG PO TABS
100.0000 mg | ORAL_TABLET | Freq: Once | ORAL | Status: AC
  Administered 2016-12-14: 100 mg via ORAL
  Filled 2016-12-14: qty 1

## 2016-12-14 MED ORDER — MAGNESIUM SULFATE 2 GM/50ML IV SOLN
2.0000 g | Freq: Once | INTRAVENOUS | Status: AC
  Administered 2016-12-14: 2 g via INTRAVENOUS

## 2016-12-14 MED ORDER — METHYLPREDNISOLONE SODIUM SUCC 125 MG IJ SOLR
INTRAMUSCULAR | Status: AC
  Administered 2016-12-14: 125 mg via INTRAVENOUS
  Filled 2016-12-14: qty 2

## 2016-12-14 NOTE — ED Provider Notes (Signed)
[facility] Emergency Department Provider Note    First MD Initiated Contact with Patient 12/14/16 1549     (approximate)  I have reviewed the triage vital signs and the nursing notes.   HISTORY  Chief Complaint Cough    HPI Karen Russell is a 41 y.o. female history of COPD and one pack per day smoking history presents with 2 weeks of shortness of breath became acutely worse last night. Has been having thick white productive cough. No measured fevers. States that she is having worsening chest pain associated with the coughing. Woke up this morning with a headache that was worse with coughing. No recent antibiotic use. No recent steroids. Has been using nebulizers every 4 hours at home without significant improvement. Patient has a home oxygen tank from her recent discharge that she used last night due to worsening shortness of breath.   Past Medical History:  Diagnosis Date  . Arthritis   . Fibromyalgia syndrome   . Hyperlipemia   . Hypertension    No family history on file. Past Surgical History:  Procedure Laterality Date  . KIDNEY SURGERY    . TUBAL LIGATION     Patient Active Problem List   Diagnosis Date Noted  . Respiratory failure with hypoxia (HCC) 08/10/2016      Prior to Admission medications   Medication Sig Start Date End Date Taking? Authorizing Provider  albuterol (PROVENTIL) (2.5 MG/3ML) 0.083% nebulizer solution Inhale 3 mLs (2.5 mg total) into the lungs every 6 (six) hours as needed for wheezing or shortness of breath. 08/13/16   Adrian SaranMody, Sital, MD  azithromycin (ZITHROMAX) 250 MG tablet Take 1 tablet for 4 days 08/14/16   Adrian SaranMody, Sital, MD  benzonatate (TESSALON) 200 MG capsule Take 1 capsule (200 mg total) by mouth 3 (three) times daily. 08/13/16   Adrian SaranMody, Sital, MD  doxycycline (VIBRAMYCIN) 50 MG capsule Take 2 capsules (100 mg total) by mouth 2 (two) times daily. 12/14/16 12/21/16  Willy Eddyobinson, Towanna Avery, MD  gabapentin (NEURONTIN) 400 MG  capsule Take 400 mg by mouth 3 (three) times daily.    [provider]  guaiFENesin-codeine 100-10 MG/5ML syrup Take 5 mLs by mouth every 6 (six) hours as needed for cough. 08/13/16   Adrian SaranMody, Sital, MD  lisinopril (PRINIVIL,ZESTRIL) 20 MG tablet Take 20 mg by mouth daily.    [provider]  nicotine (NICODERM CQ - DOSED IN MG/24 HOURS) 21 mg/24hr patch Place 1 patch (21 mg total) onto the skin daily. 08/14/16   Adrian SaranMody, Sital, MD  pantoprazole (PROTONIX) 40 MG tablet Take 1 tablet (40 mg total) by mouth daily. 08/13/16   Adrian SaranMody, Sital, MD  predniSONE (DELTASONE) 10 MG tablet Take 1 tablet (10 mg total) by mouth daily with breakfast. 60 mg PO (ORAL) x 2 days 50 mg PO (ORAL)  x 2 days 40 mg PO (ORAL)  x 2 days 30 mg PO  (ORAL)  x 2 days 20 mg PO  (ORAL) x 2 days 10 mg PO  (ORAL) x 2 days then stop 08/13/16   Adrian SaranMody, Sital, MD  predniSONE (DELTASONE) 20 MG tablet Take 2 tablets (40 mg total) by mouth daily. 12/14/16 12/19/16  Willy Eddyobinson, Hawk Mones, MD  simvastatin (ZOCOR) 20 MG tablet Take 20 mg by mouth daily.    [provider]  tiotropium (SPIRIVA) 18 MCG inhalation capsule Place 1 capsule (18 mcg total) into inhaler and inhale daily. 08/14/16   Adrian SaranMody, Sital, MD    Allergies Patient has no  known allergies.    Social History Social History  Substance Use Topics  . Smoking status: Current Every Day Smoker    Packs/day: 0.50    Types: Cigarettes  . Smokeless tobacco: Never Used  . Alcohol use No    Review of Systems Patient denies headaches, rhinorrhea, blurry vision, numbness, shortness of breath, chest pain, edema, cough, abdominal pain, nausea, vomiting, diarrhea, dysuria, fevers, rashes or hallucinations unless otherwise stated above in HPI. ____________________________________________   PHYSICAL EXAM:  VITAL SIGNS: Vitals:   12/14/16 1535 12/14/16 1850  BP: (!) 143/101 118/73  Pulse: 100 80  Resp: (!) 22 18  Temp: 98.9 F (37.2 C)     Constitutional: Alert and  oriented. Tearful and mildly tachypnic Eyes: Conjunctivae are normal.  Head: Atraumatic. Nose: No congestion/rhinnorhea. Mouth/Throat: Mucous membranes are moist.   Neck: No stridor. Painless ROM.  Cardiovascular: Normal rate, regular rhythm. Grossly normal heart sounds.  Good peripheral circulation. Respiratory: mild tachypnea but no distress.  No retractions. Lungs with musical wheezing throughout,  prolonged expiratory phase Gastrointestinal: Soft and nontender. No distention. No abdominal bruits. No CVA tenderness. Musculoskeletal: No lower extremity tenderness nor edema.  No joint effusions. Neurologic:  Normal speech and language. No gross focal neurologic deficits are appreciated. No facial droop Skin:  Skin is warm, dry and intact. No rash noted. Psychiatric: Mood and affect are normal. Speech and behavior are normal.  ____________________________________________   LABS (all labs ordered are listed, but only abnormal results are displayed)  Results for orders placed or performed during the hospital encounter of 12/14/16 (from the past 24 hour(s))  Basic metabolic panel     Status: None   Collection Time: 12/14/16  3:35 PM  Result Value Ref Range   Sodium 138 135 - 145 mmol/L   Potassium 4.5 3.5 - 5.1 mmol/L   Chloride 104 101 - 111 mmol/L   CO2 25 22 - 32 mmol/L   Glucose, Bld 89 65 - 99 mg/dL   BUN 9 6 - 20 mg/dL   Creatinine, Ser 1.61 0.44 - 1.00 mg/dL   Calcium 9.6 8.9 - 09.6 mg/dL   GFR calc non Af Amer >60 >60 mL/min   GFR calc Af Amer >60 >60 mL/min   Anion gap 9 5 - 15  CBC     Status: None   Collection Time: 12/14/16  3:35 PM  Result Value Ref Range   WBC 10.7 3.6 - 11.0 K/uL   RBC 5.05 3.80 - 5.20 MIL/uL   Hemoglobin 15.9 12.0 - 16.0 g/dL   HCT 04.5 40.9 - 81.1 %   MCV 91.4 80.0 - 100.0 fL   MCH 31.5 26.0 - 34.0 pg   MCHC 34.5 32.0 - 36.0 g/dL   RDW 91.4 78.2 - 95.6 %   Platelets 317 150 - 440 K/uL  Troponin I     Status: None   Collection Time:  12/14/16  3:35 PM  Result Value Ref Range   Troponin I <0.03 <0.03 ng/mL   ____________________________________________  EKG My review and personal interpretation at Time: 15:44   Indication: cough  Rate: 95  Rhythm: sinus Axis: normal Other: normal intervals, no acute ischemic changes ____________________________________________  RADIOLOGY  I personally reviewed all radiographic images ordered to evaluate for the above acute complaints and reviewed radiology reports and findings.  These findings were personally discussed with the patient.  Please see medical record for radiology report.  ____________________________________________   PROCEDURES  Procedure(s) performed:  Procedures  Critical Care performed: no ____________________________________________   INITIAL IMPRESSION / ASSESSMENT AND PLAN / ED COURSE  Pertinent labs & imaging results that were available during my care of the patient were reviewed by me and considered in my medical decision making (see chart for details).  DDX: Asthma, copd, CHF, pna, ptx, malignancy, Pe, anemia   Karen Russell is a 42 y.o. who presents to the ED with shortness of breath cough and wheezing as described above. Patient afebrile hemodynamic stable. Presentation is concerning for COPD with acute bronchitis. Based on significant lung findings on physicial exam this is concistent with copd/bronchitis and not PE.  Chest x-ray shows no evidence of heart failure or pneumothorax.  Will provide IV steroids, nebulizer treqatment and reassessment.  The patient will be placed on continuous pulse oximetry and telemetry for monitoring.  Laboratory evaluation will be sent to evaluate for the above complaints.     Clinical Course as of Dec 14 2029  Thu Dec 14, 2016  1610 Patient with improvement in respirations. It does appear that the magnesium and sterile Zyrtec and affect. She is in no respiratory distress and is speaking in complete sentences  still with persistent cough. Patient will be started on doxycycline. Patient will also be given albuterol with a MDI spacer.  [PR]    Clinical Course User Index [PR] Willy Eddy, MD     ____________________________________________   FINAL CLINICAL IMPRESSION(S) / ED DIAGNOSES  Final diagnoses:  COPD (chronic obstructive pulmonary disease) with acute bronchitis (HCC)      NEW MEDICATIONS STARTED DURING THIS VISIT:  Discharge Medication List as of 12/14/2016  6:37 PM    START taking these medications   Details  doxycycline (VIBRAMYCIN) 50 MG capsule Take 2 capsules (100 mg total) by mouth 2 (two) times daily., Starting Thu 12/14/2016, Until Thu 12/21/2016, Print    !! predniSONE (DELTASONE) 20 MG tablet Take 2 tablets (40 mg total) by mouth daily., Starting Thu 12/14/2016, Until Tue 12/19/2016, Print     !! - Potential duplicate medications found. Please discuss with provider.       Note:  This document was prepared using Dragon voice recognition software and may include unintentional dictation errors.    Willy Eddy, MD 12/14/16 2031

## 2016-12-14 NOTE — ED Notes (Signed)
Pt ambulates down hallway, oxygen starts at 97% and goes down to 94%. Pt ambulates independently but reports fatigue and "lightheadedness".

## 2016-12-14 NOTE — ED Notes (Signed)
Pt alert and oriented X4, active, cooperative, pt in NAD. Pt informed to return if any life threatening symptoms occur.

## 2016-12-14 NOTE — ED Triage Notes (Signed)
Pt to triage via wheelchair.  Pt has a cough for 2 weeks, using breathing treatments without relief.  cig smoker.  Hx copd  No chest pain.  Pt alert.

## 2016-12-14 NOTE — ED Notes (Signed)
Cough and bilateral rib "cramping" X 1 week. Pt alert and oriented X4, active, cooperative, pt in NAD. RR even and unlabored, color WNL.  Pt tearful.

## 2017-03-16 ENCOUNTER — Emergency Department
Admission: EM | Admit: 2017-03-16 | Discharge: 2017-03-16 | Disposition: A | Discharge status: Home / Self Care | Payer: Self-pay | Attending: Emergency Medicine | Admitting: Emergency Medicine

## 2017-03-16 ENCOUNTER — Encounter: Payer: Self-pay | Admitting: Emergency Medicine

## 2017-03-16 ENCOUNTER — Emergency Department: Payer: Self-pay

## 2017-03-16 DIAGNOSIS — J441 Chronic obstructive pulmonary disease with (acute) exacerbation: Secondary | ICD-10-CM | POA: Insufficient documentation

## 2017-03-16 DIAGNOSIS — I10 Essential (primary) hypertension: Secondary | ICD-10-CM | POA: Insufficient documentation

## 2017-03-16 DIAGNOSIS — F17210 Nicotine dependence, cigarettes, uncomplicated: Secondary | ICD-10-CM | POA: Insufficient documentation

## 2017-03-16 DIAGNOSIS — R05 Cough: Secondary | ICD-10-CM | POA: Insufficient documentation

## 2017-03-16 HISTORY — DX: Chronic obstructive pulmonary disease, unspecified: J44.9

## 2017-03-16 MED ORDER — IPRATROPIUM-ALBUTEROL 0.5-2.5 (3) MG/3ML IN SOLN
RESPIRATORY_TRACT | Status: AC
  Administered 2017-03-16: 3 mL via RESPIRATORY_TRACT
  Filled 2017-03-16: qty 6

## 2017-03-16 MED ORDER — AZITHROMYCIN 500 MG PO TABS
500.0000 mg | ORAL_TABLET | Freq: Once | ORAL | Status: AC
  Administered 2017-03-16: 500 mg via ORAL
  Filled 2017-03-16: qty 1

## 2017-03-16 MED ORDER — IPRATROPIUM-ALBUTEROL 0.5-2.5 (3) MG/3ML IN SOLN
3.0000 mL | Freq: Once | RESPIRATORY_TRACT | Status: AC
  Administered 2017-03-16: 3 mL via RESPIRATORY_TRACT

## 2017-03-16 MED ORDER — PREDNISONE 20 MG PO TABS
ORAL_TABLET | ORAL | Status: AC
  Administered 2017-03-16: 60 mg via ORAL
  Filled 2017-03-16: qty 3

## 2017-03-16 MED ORDER — PREDNISONE 20 MG PO TABS: 40.0000 mg | ORAL_TABLET | Freq: Every day | ORAL | 0 refills | Status: DC

## 2017-03-16 MED ORDER — AZITHROMYCIN 250 MG PO TABS: ORAL_TABLET | ORAL | 0 refills | Status: DC

## 2017-03-16 MED ORDER — PREDNISONE 20 MG PO TABS
60.0000 mg | ORAL_TABLET | Freq: Once | ORAL | Status: AC
  Administered 2017-03-16: 60 mg via ORAL

## 2017-03-16 MED ORDER — IPRATROPIUM-ALBUTEROL 0.5-2.5 (3) MG/3ML IN SOLN
3.0000 mL | Freq: Once | RESPIRATORY_TRACT | Status: AC
Start: 2017-03-16 — End: 2017-03-16
  Administered 2017-03-16: 3 mL via RESPIRATORY_TRACT
  Filled 2017-03-16: qty 3

## 2017-03-16 MED ORDER — ALBUTEROL SULFATE (2.5 MG/3ML) 0.083% IN NEBU: 2.5000 mg | INHALATION_SOLUTION | Freq: Four times a day (QID) | RESPIRATORY_TRACT | 1 refills | Status: DC | PRN

## 2017-03-16 MED ORDER — IPRATROPIUM-ALBUTEROL 0.5-2.5 (3) MG/3ML IN SOLN
3.0000 mL | Freq: Once | RESPIRATORY_TRACT | Status: AC
  Administered 2017-03-16: 3 mL via RESPIRATORY_TRACT
  Filled 2017-03-16: qty 3

## 2017-03-16 NOTE — ED Notes (Signed)
Pt states that she hasn't been able to breath well for a couple of days. Says its hard to breath when sleeping and she wakes up coughing and she is SOB just walking to bathroom at home. Friend at bedside.

## 2017-03-16 NOTE — Discharge Instructions (Signed)
We believe that your symptoms are caused today by an exacerbation of your COPD, and possibly bronchitis.  Please take the prescribed medications and any medications that you have at home for your COPD.  Follow up with your doctor as recommended.  If you develop any new or worsening symptoms, including but not limited to fever, persistent vomiting, worsening shortness of breath, or other symptoms that concern you, please return to the Emergency Department immediately. ° °

## 2017-03-16 NOTE — ED Triage Notes (Signed)
Pt to ED c/o shortness of breath. Pt states that she has hx/o COPD and had been having a flare for the past few days. Pt has been using her albuterol without relief. Pt has inspiratory and expiratory wheezes in all lung fields. Pt states that shortness of breath is worse when she tries to lay down and sleep and when she is ambulating.

## 2017-03-16 NOTE — ED Notes (Signed)
Pt discussed with Dr. Scotty Court. Verbal order given for 2 duoneb treatments and 60 mg of prednisone
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
## 2017-04-02 ENCOUNTER — Emergency Department
Admission: EM | Admit: 2017-04-02 | Discharge: 2017-04-02 | Disposition: A | Discharge status: Home / Self Care | Payer: Self-pay | Attending: Emergency Medicine | Admitting: Emergency Medicine

## 2017-04-02 ENCOUNTER — Emergency Department: Payer: Self-pay

## 2017-04-02 ENCOUNTER — Encounter: Payer: Self-pay | Admitting: *Deleted

## 2017-04-02 DIAGNOSIS — I10 Essential (primary) hypertension: Secondary | ICD-10-CM | POA: Insufficient documentation

## 2017-04-02 DIAGNOSIS — J441 Chronic obstructive pulmonary disease with (acute) exacerbation: Secondary | ICD-10-CM | POA: Insufficient documentation

## 2017-04-02 DIAGNOSIS — Z79899 Other long term (current) drug therapy: Secondary | ICD-10-CM | POA: Insufficient documentation

## 2017-04-02 DIAGNOSIS — F17210 Nicotine dependence, cigarettes, uncomplicated: Secondary | ICD-10-CM | POA: Insufficient documentation

## 2017-04-02 MED ORDER — METHYLPREDNISOLONE SODIUM SUCC 125 MG IJ SOLR
125.0000 mg | Freq: Once | INTRAMUSCULAR | Status: AC
  Administered 2017-04-02: 125 mg via INTRAVENOUS
  Filled 2017-04-02: qty 2

## 2017-04-02 MED ORDER — IPRATROPIUM-ALBUTEROL 0.5-2.5 (3) MG/3ML IN SOLN
3.0000 mL | Freq: Once | RESPIRATORY_TRACT | Status: AC
  Administered 2017-04-02: 3 mL via RESPIRATORY_TRACT
  Filled 2017-04-02: qty 3

## 2017-04-02 MED ORDER — SODIUM CHLORIDE 0.9 % IV BOLUS (SEPSIS): 1000.0000 mL | Freq: Once | INTRAVENOUS | Status: DC

## 2017-04-02 MED ORDER — AZITHROMYCIN 500 MG PO TABS
500.0000 mg | ORAL_TABLET | Freq: Once | ORAL | Status: AC
  Administered 2017-04-02: 500 mg via ORAL
  Filled 2017-04-02: qty 1

## 2017-04-02 MED ORDER — ALBUTEROL SULFATE HFA 108 (90 BASE) MCG/ACT IN AERS: 2.0000 | INHALATION_SPRAY | Freq: Four times a day (QID) | RESPIRATORY_TRACT | 0 refills | Status: DC | PRN

## 2017-04-02 MED ORDER — PREDNISONE 10 MG PO TABS: 50.0000 mg | ORAL_TABLET | Freq: Every day | ORAL | 0 refills | Status: AC

## 2017-04-02 MED ORDER — AZITHROMYCIN 250 MG PO TABS: ORAL_TABLET | ORAL | 0 refills | Status: AC

## 2017-04-02 MED ORDER — SPACER/AERO CHAMBER MOUTHPIECE MISC: 1.0000 [IU] | 0 refills | Status: DC | PRN

## 2017-04-02 NOTE — ED Triage Notes (Signed)
Pt was at home and came in by EMS for Shortness of Breath. Was here on the 11th with same complaints. No improvement noticed at all by the patient.

## 2017-04-02 NOTE — ED Notes (Signed)
Oxygen off

## 2017-04-02 NOTE — ED Notes (Signed)
EDP aware of O2 sat. Pt stable to go home

## 2017-04-02 NOTE — ED Provider Notes (Signed)
[facility] Emergency Department Provider Note  ____________________________________________   First MD Initiated Contact with Patient 04/02/17 1837     (approximate)  I have reviewed the triage vital signs and the nursing notes.   HISTORY  Chief Complaint Shortness of Breath   HPI Karen Russell is a 41 y.o. female who comes to the emergency department by EMS with roughly 1 month of cough shortness of breath and fatigue.  She has a past medical history of COPD as well as hypertension although has financial difficulty affording her medications.  She normally uses a rescue inhaler although it is become decreasingly effective.  She denies fevers or chills.  She has exertional fatigue recently which prompted call today.  When EMS arrived at her home she was saturating 92% and they gave her 1 DuoNeb with improvement in her symptoms.  She does have sharp aching upper chest pain worse with cough improved when not coughing.    Past Medical History:  Diagnosis Date  . Arthritis   . COPD (chronic obstructive pulmonary disease) (HCC)   . Fibromyalgia syndrome   . Hyperlipemia   . Hypertension     Patient Active Problem List   Diagnosis Date Noted  . Respiratory failure with hypoxia (HCC) 08/10/2016    Past Surgical History:  Procedure Laterality Date  . KIDNEY SURGERY    . TUBAL LIGATION      Prior to Admission medications   Medication Sig Start Date End Date Taking? Authorizing Provider  albuterol (PROVENTIL HFA;VENTOLIN HFA) 108 (90 Base) MCG/ACT inhaler Inhale 2 puffs into the lungs every 6 (six) hours as needed for wheezing or shortness of breath. 04/02/17   Merrily Brittle, MD  albuterol (PROVENTIL) (2.5 MG/3ML) 0.083% nebulizer solution Take 3 mLs (2.5 mg total) by nebulization every 6 (six) hours as needed for wheezing or shortness of breath. 03/16/17   Sharyn Creamer, MD  azithromycin (ZITHROMAX Z-PAK) 250 MG tablet Take 2 tablets (500 mg) on  Day 1,   followed by 1 tablet (250 mg) once daily on Days 2 through 5. 04/02/17 04/07/17  Merrily Brittle, MD  azithromycin (ZITHROMAX) 250 MG tablet Take 1 tab by mouth daily for 4 days 03/16/17   Sharyn Creamer, MD  predniSONE (DELTASONE) 10 MG tablet Take 5 tablets (50 mg total) by mouth daily. 04/02/17 04/06/17  Merrily Brittle, MD  predniSONE (DELTASONE) 20 MG tablet Take 2 tablets (40 mg total) by mouth daily. 03/16/17   Sharyn Creamer, MD  Spacer/Aero Chamber Mouthpiece MISC 1 Units by Does not apply route every 4 (four) hours as needed (wheezing). 04/02/17   Merrily Brittle, MD    Allergies Patient has no known allergies.  No family history on file.  Social History Social History  Substance Use Topics  . Smoking status: Current Every Day Smoker    Packs/day: 0.50    Types: Cigarettes  . Smokeless tobacco: Never Used  . Alcohol use No    Review of Systems Constitutional: No fever/chills Eyes: No visual changes. ENT: No sore throat. Cardiovascular: Positive for chest pain. Respiratory: Positive for shortness of breath. Gastrointestinal: No abdominal pain.  No nausea, no vomiting.  No diarrhea.  No constipation. Genitourinary: Negative for dysuria. Musculoskeletal: Negative for back pain. Skin: Negative for rash. Neurological: Negative for headaches, focal weakness or numbness.   ____________________________________________   PHYSICAL EXAM:  VITAL SIGNS: ED Triage Vitals  Enc Vitals Group     BP      Pulse  Resp      Temp      Temp src      SpO2      Weight      Height      Head Circumference      Peak Flow      Pain Score      Pain Loc      Pain Edu?      Excl. in GC?     Constitutional: Alert and oriented x4 appears somewhat short of breath tearful Eyes: PERRL EOMI. Head: Atraumatic. Nose: No congestion/rhinnorhea. Mouth/Throat: No trismus Neck: No stridor.   Cardiovascular: Normal rate, regular rhythm. Grossly normal heart sounds.  Good peripheral  circulation. Respiratory: Increased respiratory effort with diffuse wheeze and rhonchi in all fields although moving good air Gastrointestinal: Soft nontender Musculoskeletal: No lower extremity edema   Neurologic:  Normal speech and language. No gross focal neurologic deficits are appreciated. Skin:  Skin is warm, dry and intact. No rash noted. Psychiatric: Sad affect   ____________________________________________   DIFFERENTIAL includes but not limited to  COPD exacerbation, pneumonia, pneumothorax, lobar collapse ____________________________________________   LABS (all labs ordered are listed, but only abnormal results are displayed)  Labs Reviewed - No data to display   __________________________________________  EKG  ED ECG REPORT I, Merrily BrittleNeil Marsha Hillman, the attending physician, personally viewed and interpreted this ECG.  Date: 04/02/2017 EKG Time:  Rate: 105 Rhythm: Sinus tachycardia QRS Axis: Rightward Intervals: normal ST/T Wave abnormalities: normal Narrative Interpretation: no evidence of acute ischemia  ____________________________________________  RADIOLOGY  Chest x-ray reviewed by me no acute disease ____________________________________________   PROCEDURES  Procedure(s) performed: no  Procedures  Critical Care performed: no  Observation: no ____________________________________________   INITIAL IMPRESSION / ASSESSMENT AND PLAN / ED COURSE  Pertinent labs & imaging results that were available during my care of the patient were reviewed by me and considered in my medical decision making (see chart for details).  Patient arrives low-grade tachycardia although this could be secondary to albuterol.  She is clearly rhonchorous with wheezes throughout.  3 duo nebs, Solu-Medrol, fluids, as well as azithromycin and her chest x-ray are pending.  She is saturating 93 on arrival and I do anticipate outpatient management.      ----------------------------------------- 8:05 PM on 04/02/2017 -----------------------------------------  After 3 breathing treatments the patient feels significantly improved.  Her lungs are now more clear and she is able to ambulate without difficulty.  She is saturating well.  I will discharge her home with her understanding that she must establish care with a primary care physician for recheck.  She is discharged home in improved condition verbalizes understanding and agreement the plan.  ____________________________________________   FINAL CLINICAL IMPRESSION(S) / ED DIAGNOSES  Final diagnoses:  COPD exacerbation (HCC)      NEW MEDICATIONS STARTED DURING THIS VISIT:  New Prescriptions   ALBUTEROL (PROVENTIL HFA;VENTOLIN HFA) 108 (90 BASE) MCG/ACT INHALER    Inhale 2 puffs into the lungs every 6 (six) hours as needed for wheezing or shortness of breath.   AZITHROMYCIN (ZITHROMAX Z-PAK) 250 MG TABLET    Take 2 tablets (500 mg) on  Day 1,  followed by 1 tablet (250 mg) once daily on Days 2 through 5.   PREDNISONE (DELTASONE) 10 MG TABLET    Take 5 tablets (50 mg total) by mouth daily.   SPACER/AERO CHAMBER MOUTHPIECE MISC    1 Units by Does not apply route every 4 (four) hours as needed (wheezing).  Note:  This document was prepared using Dragon voice recognition software and may include unintentional dictation errors.     Merrily Brittle, MD 04/02/17 2006

## 2017-04-02 NOTE — Discharge Instructions (Signed)
Please take all of your steroids and all of your antibiotics as prescribed.  Make an appointment to establish care with a primary care physician within the next week for reevaluation and return to the emergency department sooner for any concerns whatsoever.  It was a pleasure to take care of you today, and thank you for coming to our emergency department.  If you have any questions or concerns before leaving please ask the nurse to grab me and I'm more than happy to go through your aftercare instructions again.  If you were prescribed any opioid pain medication today such as Norco, Vicodin, Percocet, morphine, hydrocodone, or oxycodone please make sure you do not drive when you are taking this medication as it can alter your ability to drive safely.  If you have any concerns once you are home that you are not improving or are in fact getting worse before you can make it to your follow-up appointment, please do not hesitate to call 911 and come back for further evaluation.  Merrily BrittleNeil Chuck Caban, MD  Results for orders placed or performed during the hospital encounter of 12/14/16  Basic metabolic panel  Result Value Ref Range   Sodium 138 135 - 145 mmol/L   Potassium 4.5 3.5 - 5.1 mmol/L   Chloride 104 101 - 111 mmol/L   CO2 25 22 - 32 mmol/L   Glucose, Bld 89 65 - 99 mg/dL   BUN 9 6 - 20 mg/dL   Creatinine, Ser 8.650.80 0.44 - 1.00 mg/dL   Calcium 9.6 8.9 - 78.410.3 mg/dL   GFR calc non Af Amer >60 >60 mL/min   GFR calc Af Amer >60 >60 mL/min   Anion gap 9 5 - 15  CBC  Result Value Ref Range   WBC 10.7 3.6 - 11.0 K/uL   RBC 5.05 3.80 - 5.20 MIL/uL   Hemoglobin 15.9 12.0 - 16.0 g/dL   HCT 69.646.2 29.535.0 - 28.447.0 %   MCV 91.4 80.0 - 100.0 fL   MCH 31.5 26.0 - 34.0 pg   MCHC 34.5 32.0 - 36.0 g/dL   RDW 13.213.7 44.011.5 - 10.214.5 %   Platelets 317 150 - 440 K/uL  Troponin I  Result Value Ref Range   Troponin I <0.03 <0.03 ng/mL   Dg Chest 2 View  Result Date: 04/02/2017 CLINICAL DATA:  Dyspnea EXAM: CHEST  2  VIEW COMPARISON:  03/16/2017 FINDINGS: The heart size and mediastinal contours are within normal limits. Stable hyperinflation of the lungs without acute pneumonic consolidation or CHF. No effusion or pneumothorax. The visualized skeletal structures are unremarkable. IMPRESSION: Hyperinflated lungs without acute pulmonary disease. Electronically Signed   By: Tollie Ethavid  Kwon M.D.   On: 04/02/2017 19:36   Dg Chest 2 View  Result Date: 03/16/2017 CLINICAL DATA:  Short of breath.  COPD EXAM: CHEST  2 VIEW COMPARISON:  12/14/2016 all FINDINGS: Normal mediastinum and cardiac silhouette. Mild hyperinflated lungs with flattened hemidiaphragms Normal pulmonary vasculature. No evidence of effusion, infiltrate, or pneumothorax. No acute bony abnormality. IMPRESSION: Hyperinflated lungs.  No acute findings. Electronically Signed   By: Genevive BiStewart  Edmunds M.D.   On: 03/16/2017 19:19

## 2017-05-05 ENCOUNTER — Emergency Department: Payer: Self-pay

## 2017-05-05 ENCOUNTER — Other Ambulatory Visit: Payer: Self-pay

## 2017-05-05 ENCOUNTER — Encounter: Payer: Self-pay | Admitting: Emergency Medicine

## 2017-05-05 ENCOUNTER — Emergency Department
Admission: EM | Admit: 2017-05-05 | Discharge: 2017-05-05 | Disposition: A | Discharge status: Home / Self Care | Payer: Self-pay | Attending: Emergency Medicine | Admitting: Emergency Medicine

## 2017-05-05 DIAGNOSIS — R079 Chest pain, unspecified: Secondary | ICD-10-CM | POA: Insufficient documentation

## 2017-05-05 DIAGNOSIS — R05 Cough: Secondary | ICD-10-CM | POA: Insufficient documentation

## 2017-05-05 DIAGNOSIS — I10 Essential (primary) hypertension: Secondary | ICD-10-CM | POA: Insufficient documentation

## 2017-05-05 DIAGNOSIS — J441 Chronic obstructive pulmonary disease with (acute) exacerbation: Secondary | ICD-10-CM | POA: Insufficient documentation

## 2017-05-05 DIAGNOSIS — F17210 Nicotine dependence, cigarettes, uncomplicated: Secondary | ICD-10-CM | POA: Insufficient documentation

## 2017-05-05 DIAGNOSIS — R062 Wheezing: Secondary | ICD-10-CM | POA: Insufficient documentation

## 2017-05-05 DIAGNOSIS — M545 Low back pain: Secondary | ICD-10-CM | POA: Insufficient documentation

## 2017-05-05 DIAGNOSIS — Z79899 Other long term (current) drug therapy: Secondary | ICD-10-CM | POA: Insufficient documentation

## 2017-05-05 LAB — CBC WITH DIFFERENTIAL/PLATELET
BASOS ABS: 0.3 10*3/uL — AB (ref 0–0.1)
BASOS PCT: 3 %
EOS PCT: 13 %
Eosinophils Absolute: 1.2 10*3/uL — ABNORMAL HIGH (ref 0–0.7)
HCT: 44.1 % (ref 35.0–47.0)
Hemoglobin: 15.3 g/dL (ref 12.0–16.0)
Lymphocytes Relative: 26 %
Lymphs Abs: 2.5 10*3/uL (ref 1.0–3.6)
MCH: 31.8 pg (ref 26.0–34.0)
MCHC: 34.6 g/dL (ref 32.0–36.0)
MCV: 91.9 fL (ref 80.0–100.0)
MONO ABS: 0.7 10*3/uL (ref 0.2–0.9)
Monocytes Relative: 7 %
Neutro Abs: 5 10*3/uL (ref 1.4–6.5)
Neutrophils Relative %: 51 %
PLATELETS: 318 10*3/uL (ref 150–440)
RBC: 4.8 MIL/uL (ref 3.80–5.20)
RDW: 13.3 % (ref 11.5–14.5)
WBC: 9.6 10*3/uL (ref 3.6–11.0)

## 2017-05-05 LAB — BASIC METABOLIC PANEL
ANION GAP: 8 (ref 5–15)
BUN: 7 mg/dL (ref 6–20)
CALCIUM: 9.6 mg/dL (ref 8.9–10.3)
CO2: 24 mmol/L (ref 22–32)
Chloride: 105 mmol/L (ref 101–111)
Creatinine, Ser: 0.78 mg/dL (ref 0.44–1.00)
Glucose, Bld: 110 mg/dL — ABNORMAL HIGH (ref 65–99)
Potassium: 4.1 mmol/L (ref 3.5–5.1)
SODIUM: 137 mmol/L (ref 135–145)

## 2017-05-05 MED ORDER — PREDNISONE 20 MG PO TABS: 60.0000 mg | ORAL_TABLET | Freq: Every day | ORAL | 0 refills | Status: AC

## 2017-05-05 MED ORDER — ALBUTEROL SULFATE (2.5 MG/3ML) 0.083% IN NEBU: 2.5000 mg | INHALATION_SOLUTION | Freq: Four times a day (QID) | RESPIRATORY_TRACT | 1 refills | Status: DC | PRN

## 2017-05-05 MED ORDER — SODIUM CHLORIDE 0.9 % IV BOLUS (SEPSIS)
1000.0000 mL | Freq: Once | INTRAVENOUS | Status: AC
  Administered 2017-05-05: 1000 mL via INTRAVENOUS

## 2017-05-05 MED ORDER — IPRATROPIUM-ALBUTEROL 0.5-2.5 (3) MG/3ML IN SOLN
RESPIRATORY_TRACT | Status: AC
  Administered 2017-05-05: 3 mL via RESPIRATORY_TRACT
  Filled 2017-05-05: qty 6

## 2017-05-05 MED ORDER — ALBUTEROL SULFATE (2.5 MG/3ML) 0.083% IN NEBU
2.5000 mg | INHALATION_SOLUTION | Freq: Once | RESPIRATORY_TRACT | Status: AC
  Administered 2017-05-05: 2.5 mg via RESPIRATORY_TRACT
  Filled 2017-05-05: qty 3

## 2017-05-05 MED ORDER — CYCLOBENZAPRINE HCL 10 MG PO TABS: 10.0000 mg | ORAL_TABLET | Freq: Three times a day (TID) | ORAL | 0 refills | Status: DC | PRN

## 2017-05-05 MED ORDER — METHYLPREDNISOLONE SODIUM SUCC 125 MG IJ SOLR
125.0000 mg | Freq: Once | INTRAMUSCULAR | Status: AC
  Administered 2017-05-05: 125 mg via INTRAVENOUS

## 2017-05-05 MED ORDER — KETOROLAC TROMETHAMINE 30 MG/ML IJ SOLN
30.0000 mg | Freq: Once | INTRAMUSCULAR | Status: AC
Start: 2017-05-05 — End: 2017-05-05
  Administered 2017-05-05: 30 mg via INTRAVENOUS
  Filled 2017-05-05: qty 1

## 2017-05-05 MED ORDER — METHYLPREDNISOLONE SODIUM SUCC 125 MG IJ SOLR
INTRAMUSCULAR | Status: AC
  Administered 2017-05-05: 125 mg via INTRAVENOUS
  Filled 2017-05-05: qty 2

## 2017-05-05 MED ORDER — IPRATROPIUM-ALBUTEROL 0.5-2.5 (3) MG/3ML IN SOLN
3.0000 mL | Freq: Once | RESPIRATORY_TRACT | Status: AC
  Administered 2017-05-05: 3 mL via RESPIRATORY_TRACT

## 2017-05-05 NOTE — ED Provider Notes (Signed)
[facility] Emergency Department Provider Note ____________________________________________   First MD Initiated Contact with Patient 05/05/17 1350     (approximate)  I have reviewed the triage vital signs and the nursing notes.   HISTORY  Chief Complaint Shortness of Breath    HPI Karen Russell is a 41 y.o. female with a history of COPD, hypertension, fibromyalgia, and other past medical history as noted below, who presents for shortness of breath over the last 2 days, gradual onset, associated with nonproductive cough, wheezing, and some chest and back pain with the cough.  Patient denies fever or chills.  She states that the symptoms feel similar to prior COPD flares.  She states she has been using her inhalers at home with no relief.  Past Medical History:  Diagnosis Date  . Arthritis   . COPD (chronic obstructive pulmonary disease) (HCC)   . Fibromyalgia syndrome   . Hyperlipemia   . Hypertension     Patient Active Problem List   Diagnosis Date Noted  . Respiratory failure with hypoxia (HCC) 08/10/2016    Past Surgical History:  Procedure Laterality Date  . KIDNEY SURGERY    . TUBAL LIGATION      Prior to Admission medications   Medication Sig Start Date End Date Taking? Authorizing Provider  albuterol (PROVENTIL HFA;VENTOLIN HFA) 108 (90 Base) MCG/ACT inhaler Inhale 2 puffs into the lungs every 6 (six) hours as needed for wheezing or shortness of breath. 04/02/17  Yes Merrily Brittleifenbark, Neil, MD  albuterol (PROVENTIL) (2.5 MG/3ML) 0.083% nebulizer solution Take 3 mLs (2.5 mg total) by nebulization every 6 (six) hours as needed for wheezing or shortness of breath. 03/16/17  Yes Sharyn CreamerQuale, Mark, MD  gabapentin (NEURONTIN) 400 MG capsule Take 400 mg by mouth 3 (three) times daily. 03/29/17  Yes [provider]  meloxicam (MOBIC) 7.5 MG tablet Take 7.5 mg by mouth 2 (two) times daily. 04/20/17  Yes [provider]  Spacer/Aero Chamber  Mouthpiece MISC 1 Units by Does not apply route every 4 (four) hours as needed (wheezing). 04/02/17  Yes Merrily Brittleifenbark, Neil, MD  predniSONE (DELTASONE) 20 MG tablet Take 2 tablets (40 mg total) by mouth daily. Patient not taking: Reported on 05/05/2017 03/16/17   Sharyn CreamerQuale, Mark, MD    Allergies Patient has no known allergies.  No family history on file.  Social History Social History   Tobacco Use  . Smoking status: Current Every Day Smoker    Packs/day: 0.50    Types: Cigarettes  . Smokeless tobacco: Never Used  Substance Use Topics  . Alcohol use: No  . Drug use: No    Review of Systems  Constitutional: No fever/chills Eyes: No visual changes. ENT: No sore throat. Cardiovascular: Positive for chest pain with cough. Respiratory: Positive for shortness of breath. Gastrointestinal: No nausea, no vomiting.   Genitourinary: Negative for dysuria.  Musculoskeletal: Positive for back pain. Skin: Negative for rash. Neurological: Negative for headache.   ____________________________________________   PHYSICAL EXAM:  VITAL SIGNS: ED Triage Vitals  Enc Vitals Group     BP 05/05/17 1201 138/85     Pulse Rate 05/05/17 1201 (!) 128     Resp 05/05/17 1201 (!) 22     Temp 05/05/17 1201 98.6 F (37 C)     Temp Source 05/05/17 1201 Oral     SpO2 05/05/17 1201 93 %     Weight 05/05/17 1202 160 lb (72.6 kg)     Height 05/05/17 1202 5\' 2"  (  1.575 m)     Head Circumference --      Peak Flow --      Pain Score 05/05/17 1201 8     Pain Loc --      Pain Edu? --      Excl. in GC? --     Constitutional: Alert and oriented. Relatively comfortable appearing and in no acute distress. Eyes: Conjunctivae are normal.  Head: Atraumatic. Nose: No congestion/rhinnorhea. Mouth/Throat: Mucous membranes are moist.   Neck: Normal range of motion.  Cardiovascular: Normal rate, regular rhythm. Grossly normal heart sounds.  Good peripheral circulation.  Mild anterior chest wall tenderness,  reproducing the pain. Respiratory: Normal respiratory effort.  No retractions.  Bilateral moderate wheeze with good air entry. Gastrointestinal: No distention.  Genitourinary: No CVA tenderness. Musculoskeletal: No lower extremity edema.  Extremities warm and well perfused.  Neurologic:  Normal speech and language. No gross focal neurologic deficits are appreciated.  Skin:  Skin is warm and dry. No rash noted. Psychiatric: Mood and affect are normal. Speech and behavior are normal.  ____________________________________________   LABS (all labs ordered are listed, but only abnormal results are displayed)  Labs Reviewed  CBC WITH DIFFERENTIAL/PLATELET - Abnormal; Notable for the following components:      Result Value   Eosinophils Absolute 1.2 (*)    Basophils Absolute 0.3 (*)    All other components within normal limits  BASIC METABOLIC PANEL - Abnormal; Notable for the following components:   Glucose, Bld 110 (*)    All other components within normal limits   ____________________________________________  EKG  ED ECG REPORT I, Dionne BucySebastian Azam Gervasi, the attending physician, personally viewed and interpreted this ECG.  Date: 05/05/2017 EKG Time: 1222 Rate: 114 Rhythm: Sinus tachycardia QRS Axis: Right axis Intervals: normal ST/T Wave abnormalities: normal Narrative Interpretation: pulmonary disease pattern with no evidence of acute ischemia; no significant change when compared to EKG of 03/17/2017  ____________________________________________  RADIOLOGY  CXR: no focal infiltrate or other acute findings  ____________________________________________   PROCEDURES  Procedure(s) performed: No    Critical Care performed: No ____________________________________________   INITIAL IMPRESSION / ASSESSMENT AND PLAN / ED COURSE  Pertinent labs & imaging results that were available during my care of the patient were reviewed by me and considered in my medical decision  making (see chart for details).  41 year old female with history of COPD and other past medical history as noted above presents with shortness of breath over the last 2 days, feeling similar to prior COPD flares, and associated with some musculoskeletal type chest and back pain associated with the cough.    On review of past medical records in Epic, patient was seen in the ED twice last month, and one several months ago for similar shortness of breath and diagnosed with mild COPD exacerbations.  Patient was not admitted on those visits.  On exam, patient is relatively well-appearing and not in any respiratory distress.  Her O2 sat is in the mid to high 90s on room air, and she was slightly tachycardic on presentation but this is improved.  Other vital signs are normal.  Lungs with bilateral moderate wheeze, but good air entry.  Overall presentation is consistent with mild COPD exacerbation.  Differential also includes pneumonia or acute bronchitis.  The pain is consistent with musculoskeletal pain from the coughing.  There is no evidence of cardiac cause, patient's EKG does not show any concerning findings.  No clinical evidence to support PE.  Plan: Basic labs,  chest x-ray, nebs and steroid, and reassess.    ----------------------------------------- 3:26 PM on 05/05/2017 -----------------------------------------  Lab workup is unremarkable, patient's chest x-ray shows no acute findings.  On reassessment, she continues to have no respiratory distress, appears comfortable and still has some bilateral wheezing but it is improved.  Patient states that she feels significantly better and would like to go home.  She states she would like to go home now because she needs to attend to some tasks at home.  I will write for solution for patient's nebulizer machine, course of prednisone, and a muscle relaxer for her muscular back pain.  She is already taking ibuprofen.  Return precautions given, and patient  expresses understanding.  ____________________________________________   FINAL CLINICAL IMPRESSION(S) / ED DIAGNOSES  Final diagnoses:  COPD exacerbation (HCC)      NEW MEDICATIONS STARTED DURING THIS VISIT:  This SmartLink is deprecated. Use AVSMEDLIST instead to display the medication list for a patient.   Note:  This document was prepared using Dragon voice recognition software and may include unintentional dictation errors.    Dionne Bucy, MD 05/05/17 [phone]

## 2017-05-05 NOTE — ED Triage Notes (Signed)
Pt to ED via POV c/o shortness of breath. Pt states that she has hx/o COPD. Pt states that she has had cough and congestion for the past few days, pt is able to get some mucus up but not much. Pt states that she has been using her nebulizer treatments without relief. Pt states that she last had an albuterol treatment at 0800.

## 2017-05-25 ENCOUNTER — Emergency Department: Payer: Medicare Other

## 2017-05-25 ENCOUNTER — Inpatient Hospital Stay
Admission: EM | Admit: 2017-05-25 | Discharge: 2017-05-28 | DRG: 192 | Disposition: A | Discharge status: Home / Self Care | Payer: Medicare Other | Attending: Internal Medicine | Admitting: Internal Medicine

## 2017-05-25 ENCOUNTER — Other Ambulatory Visit: Payer: Self-pay

## 2017-05-25 DIAGNOSIS — I10 Essential (primary) hypertension: Secondary | ICD-10-CM | POA: Diagnosis present

## 2017-05-25 DIAGNOSIS — G629 Polyneuropathy, unspecified: Secondary | ICD-10-CM | POA: Diagnosis present

## 2017-05-25 DIAGNOSIS — M1990 Unspecified osteoarthritis, unspecified site: Secondary | ICD-10-CM | POA: Diagnosis present

## 2017-05-25 DIAGNOSIS — Z79899 Other long term (current) drug therapy: Secondary | ICD-10-CM | POA: Diagnosis not present

## 2017-05-25 DIAGNOSIS — J440 Chronic obstructive pulmonary disease with acute lower respiratory infection: Secondary | ICD-10-CM | POA: Diagnosis present

## 2017-05-25 DIAGNOSIS — F17210 Nicotine dependence, cigarettes, uncomplicated: Secondary | ICD-10-CM | POA: Diagnosis present

## 2017-05-25 DIAGNOSIS — J441 Chronic obstructive pulmonary disease with (acute) exacerbation: Principal | ICD-10-CM | POA: Diagnosis present

## 2017-05-25 DIAGNOSIS — J069 Acute upper respiratory infection, unspecified: Secondary | ICD-10-CM

## 2017-05-25 DIAGNOSIS — M797 Fibromyalgia: Secondary | ICD-10-CM | POA: Diagnosis present

## 2017-05-25 DIAGNOSIS — J449 Chronic obstructive pulmonary disease, unspecified: Secondary | ICD-10-CM | POA: Diagnosis present

## 2017-05-25 DIAGNOSIS — Z9981 Dependence on supplemental oxygen: Secondary | ICD-10-CM | POA: Diagnosis not present

## 2017-05-25 DIAGNOSIS — E785 Hyperlipidemia, unspecified: Secondary | ICD-10-CM | POA: Diagnosis present

## 2017-05-25 DIAGNOSIS — J209 Acute bronchitis, unspecified: Secondary | ICD-10-CM | POA: Diagnosis present

## 2017-05-25 DIAGNOSIS — Z791 Long term (current) use of non-steroidal anti-inflammatories (NSAID): Secondary | ICD-10-CM

## 2017-05-25 DIAGNOSIS — Z23 Encounter for immunization: Secondary | ICD-10-CM | POA: Diagnosis not present

## 2017-05-25 LAB — CBC
HEMATOCRIT: 42.2 % (ref 35.0–47.0)
Hemoglobin: 14.5 g/dL (ref 12.0–16.0)
MCH: 31.8 pg (ref 26.0–34.0)
MCHC: 34.2 g/dL (ref 32.0–36.0)
MCV: 92.8 fL (ref 80.0–100.0)
Platelets: 333 10*3/uL (ref 150–440)
RBC: 4.55 MIL/uL (ref 3.80–5.20)
RDW: 13.3 % (ref 11.5–14.5)
WBC: 7.8 10*3/uL (ref 3.6–11.0)

## 2017-05-25 LAB — BASIC METABOLIC PANEL
Anion gap: 8 (ref 5–15)
BUN: 6 mg/dL (ref 6–20)
CALCIUM: 9.1 mg/dL (ref 8.9–10.3)
CO2: 23 mmol/L (ref 22–32)
Chloride: 107 mmol/L (ref 101–111)
Creatinine, Ser: 0.86 mg/dL (ref 0.44–1.00)
GFR calc Af Amer: >60 mL/min (ref >60)
Glucose, Bld: 120 mg/dL — ABNORMAL HIGH (ref 65–99)
Potassium: 3.6 mmol/L (ref 3.5–5.1)
Sodium: 138 mmol/L (ref 135–145)

## 2017-05-25 LAB — BLOOD GAS, ARTERIAL
ACID-BASE DEFICIT: 3.1 mmol/L — AB (ref 0.0–2.0)
BICARBONATE: 22 mmol/L (ref 20.0–28.0)
FIO2: 0.28
O2 Saturation: 95 %
PATIENT TEMPERATURE: 37
pCO2 arterial: 39 mmHg (ref 32.0–48.0)
pH, Arterial: 7.36 (ref 7.350–7.450)
pO2, Arterial: 79 mmHg — ABNORMAL LOW (ref 83.0–108.0)

## 2017-05-25 LAB — TROPONIN I

## 2017-05-25 MED ORDER — ONDANSETRON HCL 4 MG PO TABS: 4.0000 mg | ORAL_TABLET | Freq: Four times a day (QID) | ORAL | Status: DC | PRN

## 2017-05-25 MED ORDER — CEFTRIAXONE SODIUM IN DEXTROSE 20 MG/ML IV SOLN
1.0000 g | Freq: Once | INTRAVENOUS | Status: AC
  Administered 2017-05-25: 1 g via INTRAVENOUS
  Filled 2017-05-25: qty 50

## 2017-05-25 MED ORDER — METHYLPREDNISOLONE SODIUM SUCC 125 MG IJ SOLR: 125.0000 mg | Freq: Once | INTRAMUSCULAR | Status: DC

## 2017-05-25 MED ORDER — PREDNISONE 20 MG PO TABS
ORAL_TABLET | ORAL | Status: AC
  Administered 2017-05-25: 60 mg via ORAL
  Filled 2017-05-25: qty 3

## 2017-05-25 MED ORDER — HYDROCODONE-ACETAMINOPHEN 5-325 MG PO TABS
1.0000 | ORAL_TABLET | ORAL | Status: DC | PRN
  Administered 2017-05-28: 1 via ORAL
  Filled 2017-05-25: qty 1

## 2017-05-25 MED ORDER — ACETAMINOPHEN 325 MG PO TABS
650.0000 mg | ORAL_TABLET | Freq: Four times a day (QID) | ORAL | Status: DC | PRN
  Administered 2017-05-26: 650 mg via ORAL
  Filled 2017-05-25: qty 2

## 2017-05-25 MED ORDER — BUDESONIDE 0.5 MG/2ML IN SUSP
0.5000 mg | Freq: Two times a day (BID) | RESPIRATORY_TRACT | Status: DC
  Administered 2017-05-25 – 2017-05-28 (×5): 0.5 mg via RESPIRATORY_TRACT
  Filled 2017-05-25 (×5): qty 2

## 2017-05-25 MED ORDER — PREDNISONE 20 MG PO TABS
60.0000 mg | ORAL_TABLET | Freq: Once | ORAL | Status: AC
  Administered 2017-05-25: 60 mg via ORAL

## 2017-05-25 MED ORDER — IPRATROPIUM-ALBUTEROL 0.5-2.5 (3) MG/3ML IN SOLN
RESPIRATORY_TRACT | Status: AC
  Administered 2017-05-25: 3 mL via RESPIRATORY_TRACT
  Filled 2017-05-25: qty 6

## 2017-05-25 MED ORDER — SODIUM CHLORIDE 0.9% FLUSH
3.0000 mL | Freq: Two times a day (BID) | INTRAVENOUS | Status: DC
  Administered 2017-05-26 – 2017-05-28 (×4): 3 mL via INTRAVENOUS

## 2017-05-25 MED ORDER — IPRATROPIUM-ALBUTEROL 0.5-2.5 (3) MG/3ML IN SOLN
3.0000 mL | Freq: Once | RESPIRATORY_TRACT | Status: AC
  Administered 2017-05-25: 3 mL via RESPIRATORY_TRACT

## 2017-05-25 MED ORDER — ACETAMINOPHEN 650 MG RE SUPP: 650.0000 mg | Freq: Four times a day (QID) | RECTAL | Status: DC | PRN

## 2017-05-25 MED ORDER — ONDANSETRON HCL 4 MG/2ML IJ SOLN: 4.0000 mg | Freq: Four times a day (QID) | INTRAMUSCULAR | Status: DC | PRN

## 2017-05-25 MED ORDER — NICOTINE 21 MG/24HR TD PT24
MEDICATED_PATCH | TRANSDERMAL | Status: AC
  Administered 2017-05-25: 21 mg via TRANSDERMAL
  Filled 2017-05-25: qty 1

## 2017-05-25 MED ORDER — PNEUMOCOCCAL VAC POLYVALENT 25 MCG/0.5ML IJ INJ
0.5000 mL | INJECTION | INTRAMUSCULAR | Status: AC
  Administered 2017-05-28: 0.5 mL via INTRAMUSCULAR
  Filled 2017-05-25: qty 0.5

## 2017-05-25 MED ORDER — MELOXICAM 7.5 MG PO TABS
7.5000 mg | ORAL_TABLET | Freq: Two times a day (BID) | ORAL | Status: DC
  Administered 2017-05-25 – 2017-05-26 (×2): 7.5 mg via ORAL
  Filled 2017-05-25 (×2): qty 1

## 2017-05-25 MED ORDER — NICOTINE 21 MG/24HR TD PT24
21.0000 mg | MEDICATED_PATCH | Freq: Once | TRANSDERMAL | Status: AC
  Administered 2017-05-25 (×2): 21 mg via TRANSDERMAL
  Filled 2017-05-25: qty 1

## 2017-05-25 MED ORDER — ATENOLOL 25 MG PO TABS
25.0000 mg | ORAL_TABLET | Freq: Every day | ORAL | Status: DC
  Administered 2017-05-25 – 2017-05-28 (×3): 25 mg via ORAL
  Filled 2017-05-25 (×3): qty 1

## 2017-05-25 MED ORDER — INFLUENZA VAC SPLIT QUAD 0.5 ML IM SUSY
0.5000 mL | PREFILLED_SYRINGE | INTRAMUSCULAR | Status: AC
  Administered 2017-05-28: 0.5 mL via INTRAMUSCULAR
  Filled 2017-05-25: qty 0.5

## 2017-05-25 MED ORDER — ENOXAPARIN SODIUM 40 MG/0.4ML ~~LOC~~ SOLN
40.0000 mg | SUBCUTANEOUS | Status: DC
  Administered 2017-05-25 – 2017-05-27 (×3): 40 mg via SUBCUTANEOUS
  Filled 2017-05-25 (×3): qty 0.4

## 2017-05-25 MED ORDER — ATENOLOL 25 MG PO TABS
ORAL_TABLET | ORAL | Status: AC
  Administered 2017-05-25: 25 mg via ORAL
  Filled 2017-05-25: qty 1

## 2017-05-25 MED ORDER — GUAIFENESIN ER 600 MG PO TB12
600.0000 mg | ORAL_TABLET | Freq: Two times a day (BID) | ORAL | Status: DC
  Administered 2017-05-25 – 2017-05-28 (×6): 600 mg via ORAL
  Filled 2017-05-25 (×6): qty 1

## 2017-05-25 MED ORDER — HYDRALAZINE HCL 20 MG/ML IJ SOLN: 10.0000 mg | INTRAMUSCULAR | Status: DC | PRN

## 2017-05-25 MED ORDER — GABAPENTIN 400 MG PO CAPS
400.0000 mg | ORAL_CAPSULE | Freq: Three times a day (TID) | ORAL | Status: DC
  Administered 2017-05-25 – 2017-05-28 (×8): 400 mg via ORAL
  Filled 2017-05-25 (×8): qty 1

## 2017-05-25 MED ORDER — LACTATED RINGERS IV SOLN
INTRAVENOUS | Status: DC
  Administered 2017-05-25: via INTRAVENOUS

## 2017-05-25 MED ORDER — IPRATROPIUM-ALBUTEROL 0.5-2.5 (3) MG/3ML IN SOLN
RESPIRATORY_TRACT | Status: AC
  Administered 2017-05-25
  Filled 2017-05-25: qty 3

## 2017-05-25 MED ORDER — IPRATROPIUM-ALBUTEROL 0.5-2.5 (3) MG/3ML IN SOLN
3.0000 mL | Freq: Four times a day (QID) | RESPIRATORY_TRACT | Status: DC
  Administered 2017-05-25 – 2017-05-28 (×9): 3 mL via RESPIRATORY_TRACT
  Filled 2017-05-25 (×8): qty 3

## 2017-05-25 MED ORDER — METHYLPREDNISOLONE SODIUM SUCC 125 MG IJ SOLR
60.0000 mg | Freq: Four times a day (QID) | INTRAMUSCULAR | Status: DC
  Administered 2017-05-25 – 2017-05-28 (×11): 60 mg via INTRAVENOUS
  Filled 2017-05-25 (×11): qty 2

## 2017-05-25 MED ORDER — SODIUM CHLORIDE 0.9 % IV BOLUS (SEPSIS)
1000.0000 mL | Freq: Once | INTRAVENOUS | Status: AC
  Administered 2017-05-25: 1000 mL via INTRAVENOUS

## 2017-05-25 MED ORDER — POLYETHYLENE GLYCOL 3350 17 G PO PACK: 17.0000 g | PACK | Freq: Every day | ORAL | Status: DC | PRN

## 2017-05-25 MED ORDER — AZITHROMYCIN 500 MG PO TABS
500.0000 mg | ORAL_TABLET | Freq: Once | ORAL | Status: AC
  Administered 2017-05-25: 500 mg via ORAL
  Filled 2017-05-25: qty 1

## 2017-05-25 MED ORDER — HYDROCOD POLST-CPM POLST ER 10-8 MG/5ML PO SUER
5.0000 mL | Freq: Once | ORAL | Status: AC
  Administered 2017-05-25: 5 mL via ORAL
  Filled 2017-05-25: qty 5

## 2017-05-25 MED ORDER — DEXTROSE 5 % IV SOLN
500.0000 mg | INTRAVENOUS | Status: DC
  Administered 2017-05-26 – 2017-05-27 (×2): 500 mg via INTRAVENOUS
  Filled 2017-05-25 (×3): qty 500

## 2017-05-25 NOTE — H&P (Signed)
[facility] [place] [facility]   PATIENT NAME: Karen HeirKaren Russell    MR#:  [id number]  DATE OF BIRTH:  1975-08-04  DATE OF ADMISSION:  05/25/2017  PRIMARY CARE PHYSICIAN: Evelene CroonNiemeyer, Meinder[place], MD   REQUESTING/REFERRING PHYSICIAN:   CHIEF COMPLAINT:   Chief Complaint  Patient presents with  . Shortness of Breath    HISTORY OF PRESENT ILLNESS: Karen Russell  is a 41 y.o. female with a known history per below, failed outpatient treatment for COPD exacerbation with 4 days of prednisone, presenting with progressive cough, fevers, malaise/generalized weakness, chest tightness, ER workup was unimpressive, patient now be admitted for acute on COPD exacerbation.  PAST MEDICAL HISTORY:   Past Medical History:  Diagnosis Date  . Arthritis   . COPD (chronic obstructive pulmonary disease) (HCC)   . Fibromyalgia syndrome   . Hyperlipemia   . Hypertension     PAST SURGICAL HISTORY:  Past Surgical History:  Procedure Laterality Date  . KIDNEY SURGERY    . TUBAL LIGATION      SOCIAL HISTORY:  Social History   Tobacco Use  . Smoking status: Current Every Day Smoker    Packs/day: 0.50    Types: Cigarettes  . Smokeless tobacco: Never Used  Substance Use Topics  . Alcohol use: No    FAMILY HISTORY: No family history on file.  DRUG ALLERGIES: No Known Allergies  REVIEW OF SYSTEMS:   CONSTITUTIONAL: + fever/fatigue/weakness.  EYES: No blurred or double vision.  EARS, NOSE, AND THROAT: No tinnitus or ear pain.  RESPIRATORY: +cough/shortness of breath/wheezing, no hemoptysis.  CARDIOVASCULAR: No chest pain, orthopnea, edema.  GASTROINTESTINAL: No nausea, vomiting, diarrhea or abdominal pain.  GENITOURINARY: No dysuria, hematuria.  ENDOCRINE: No polyuria, nocturia,  HEMATOLOGY: No anemia, easy bruising or bleeding SKIN: No rash or lesion. MUSCULOSKELETAL: No joint pain or arthritis.   NEUROLOGIC: No tingling, numbness, weakness.  PSYCHIATRY: No anxiety or  depression.   MEDICATIONS AT HOME:  Prior to Admission medications   Medication Sig Start Date End Date Taking? Authorizing Provider  albuterol (PROVENTIL) (2.5 MG/3ML) 0.083% nebulizer solution Take 3 mLs (2.5 mg total) by nebulization every 6 (six) hours as needed for wheezing or shortness of breath. 03/16/17  Yes Sharyn CreamerQuale, Mark, MD  gabapentin (NEURONTIN) 400 MG capsule Take 400 mg by mouth 3 (three) times daily. 03/29/17  Yes [provider]  meloxicam (MOBIC) 7.5 MG tablet Take 7.5 mg by mouth 2 (two) times daily. 04/20/17  Yes [provider]      PHYSICAL EXAMINATION:   VITAL SIGNS: Blood pressure (!) 150/99, pulse (!) 117, temperature 98.6 F (37 C), temperature source Oral, resp. rate (!) 25, height 5\' 2"  (1.575 m), weight 72.6 kg (160 lb), last menstrual period 05/25/2017, SpO2 91 %.  GENERAL:  41 y.o.-year-old patient lying in the bed with no acute distress.  EYES: Pupils equal, round, reactive to light and accommodation. No scleral icterus. Extraocular muscles intact.  HEENT: Head atraumatic, normocephalic. Oropharynx and nasopharynx clear.  NECK:  Supple, no jugular venous distention. No thyroid enlargement, no tenderness.  LUNGS: Bilateral wheezing/rhonchi with diminished breath sounds, No use of accessory muscles of respiration.  CARDIOVASCULAR: S1, S2 normal. No murmurs, rubs, or gallops.  ABDOMEN: Soft, nontender, nondistended. Bowel sounds present. No organomegaly or mass.  EXTREMITIES: No pedal edema, cyanosis, or clubbing.  NEUROLOGIC: Cranial nerves II through XII are intact. MAES. Gait not checked.  PSYCHIATRIC: The patient is alert and oriented x 3.  SKIN: No obvious rash,  lesion, or ulcer.   LABORATORY PANEL:   CBC Recent Labs  Lab 05/25/17 1726  WBC 7.8  HGB 14.5  HCT 42.2  PLT 333  MCV 92.8  MCH 31.8  MCHC 34.2  RDW 13.3    ------------------------------------------------------------------------------------------------------------------  Chemistries  Recent Labs  Lab 05/25/17 1726  NA 138  K 3.6  CL 107  CO2 23  GLUCOSE 120*  BUN 6  CREATININE 0.86  CALCIUM 9.1   ------------------------------------------------------------------------------------------------------------------ estimated creatinine clearance is 78.7 mL/min (by C-G formula based on SCr of 0.86 mg/dL). ------------------------------------------------------------------------------------------------------------------ No results for input(s): TSH, T4TOTAL, T3FREE, THYROIDAB in the last 72 hours.  Invalid input(s): FREET3   Coagulation profile No results for input(s): INR, PROTIME in the last 168 hours. ------------------------------------------------------------------------------------------------------------------- No results for input(s): DDIMER in the last 72 hours. -------------------------------------------------------------------------------------------------------------------  Cardiac Enzymes Recent Labs  Lab 05/25/17 1726  TROPONINI <0.03   ------------------------------------------------------------------------------------------------------------------ Invalid input(s): POCBNP  ---------------------------------------------------------------------------------------------------------------  Urinalysis    Component Value Date/Time   COLORURINE YELLOW (A) 09/03/2015 1914   APPEARANCEUR CLOUDY (A) 09/03/2015 1914   APPEARANCEUR Hazy 09/04/2013 1945   LABSPEC 1.010 09/03/2015 1914   LABSPEC 1.014 09/04/2013 1945   PHURINE 7.0 09/03/2015 1914   GLUCOSEU NEGATIVE 09/03/2015 1914   GLUCOSEU Negative 09/04/2013 1945   HGBUR NEGATIVE 09/03/2015 1914   BILIRUBINUR NEGATIVE 09/03/2015 1914   BILIRUBINUR Negative 09/04/2013 1945   KETONESUR NEGATIVE 09/03/2015 1914   PROTEINUR NEGATIVE 09/03/2015 1914   NITRITE POSITIVE  (A) 09/03/2015 1914   LEUKOCYTESUR 3+ (A) 09/03/2015 1914   LEUKOCYTESUR Trace 09/04/2013 1945     RADIOLOGY: Dg Chest 2 View  Result Date: 05/25/2017 CLINICAL DATA:  Cough, shortness of breath EXAM: CHEST  2 VIEW COMPARISON:  05/05/2017 FINDINGS: The heart size and mediastinal contours are within normal limits. Both lungs are clear. The visualized skeletal structures are unremarkable. IMPRESSION: No active cardiopulmonary disease. Electronically Signed   By: Elige Ko   On: 05/25/2017 17:45    EKG: Orders placed or performed during the hospital encounter of 05/25/17  . EKG 12-Lead  . EKG 12-Lead  . ED EKG within 10 minutes  . ED EKG within 10 minutes    IMPRESSION AND PLAN: 1 Acute on COPD exacerbation Failed outpatient management with prednisone Admit to regular nursing floor on her COPD protocol, empiric azithromycin, IV Solu-Medrol with tapering as tolerated, IV fluids for rehydration, inhaled corticosteroids twice daily, mucolytic agents, check sputum cultures, respiratory therapy to evaluate/treat, supplemental oxygen as needed, continue close medical monitoring  2 chronic benign essential hypertension Atenolol at bedtime, hydralazine as needed systolic blood pressure greater than 160, vitals per routine, make changes as per necessary  3 chronic hyperlipidemia, unspecified Check lipids in the morning  4 chronic tobacco smoking abuse/dependency Nicotine patch and cessation counseling ordered  5 chronic fibromyalgia Stable Continue home regiment  Full code Condition stable Prognosis fair DVT prophylaxis with Lovenox subcu Disposition Home in 1-3 days barring any complications  All the records are reviewed and case discussed with ED provider. Management plans discussed with the patient, family and they are in agreement.  CODE STATUS: Code Status History    Date Active Date Inactive Code Status Order ID Comments User Context   08/10/2016 18:21 08/13/2016 17:38  Full Code [id number]  Enedina Finner, MD Inpatient       TOTAL TIME TAKING CARE OF THIS PATIENT: 40 minutes.    Evelena Asa Salary M.D on 05/25/2017   Between 7am to 6pm - Pager - [phone]  After 6pm go to [email or web address] - password [email or web address]  [facility]  Office  [phone]  CC: Primary care physician; Evelene Croon, MD   Note: This dictation was prepared with Dragon dictation along with smaller phrase technology. Any transcriptional errors that result from this process are unintentional.

## 2017-05-25 NOTE — ED Provider Notes (Addendum)
[facility] Emergency Department Provider Note  Time seen: 6:19 PM  I have reviewed the triage vital signs and the nursing notes.   HISTORY  Chief Complaint Shortness of Breath    HPI Karen Russell is a 41 y.o. female with a past medical history of COPD, fibromyalgia, hypertension, hyperlipidemia presents to the emergency department with cough and congestion.  According to the patient for the past 7 days she has been coughing with significant wheeze.  She has been using her home albuterol with minimal relief so she came to the emergency department for evaluation.  Patient was seen in the emergency department at the beginning of the illness and prescribed prednisone for 4 days per patient.  She finished this and was feeling a little better but has now began to feel worse.  Denies any fever.  States she feels a lot of chest congestion but is not able to get any sputum up with her cough.  Patient does state right-sided chest pain but only when coughing.  Past Medical History:  Diagnosis Date  . Arthritis   . COPD (chronic obstructive pulmonary disease) (HCC)   . Fibromyalgia syndrome   . Hyperlipemia   . Hypertension     Patient Active Problem List   Diagnosis Date Noted  . Respiratory failure with hypoxia (HCC) 08/10/2016    Past Surgical History:  Procedure Laterality Date  . KIDNEY SURGERY    . TUBAL LIGATION      Prior to Admission medications   Medication Sig Start Date End Date Taking? Authorizing Provider  albuterol (PROVENTIL HFA;VENTOLIN HFA) 108 (90 Base) MCG/ACT inhaler Inhale 2 puffs into the lungs every 6 (six) hours as needed for wheezing or shortness of breath. 04/02/17   Merrily Brittleifenbark, Neil, MD  albuterol (PROVENTIL) (2.5 MG/3ML) 0.083% nebulizer solution Take 3 mLs (2.5 mg total) by nebulization every 6 (six) hours as needed for wheezing or shortness of breath. 03/16/17   Sharyn CreamerQuale, Mark, MD  albuterol (PROVENTIL) (2.5 MG/3ML) 0.083% nebulizer  solution Take 3 mLs (2.5 mg total) by nebulization every 6 (six) hours as needed for wheezing or shortness of breath. 05/05/17   Dionne BucySiadecki, Sebastian, MD  cyclobenzaprine (FLEXERIL) 10 MG tablet Take 1 tablet (10 mg total) by mouth 3 (three) times daily as needed for muscle spasms. 05/05/17   Dionne BucySiadecki, Sebastian, MD  gabapentin (NEURONTIN) 400 MG capsule Take 400 mg by mouth 3 (three) times daily. 03/29/17   [provider]  meloxicam (MOBIC) 7.5 MG tablet Take 7.5 mg by mouth 2 (two) times daily. 04/20/17   [provider]  Spacer/Aero Chamber Mouthpiece MISC 1 Units by Does not apply route every 4 (four) hours as needed (wheezing). 04/02/17   Merrily Brittleifenbark, Neil, MD    No Known Allergies  No family history on file.  Social History Social History   Tobacco Use  . Smoking status: Current Every Day Smoker    Packs/day: 0.50    Types: Cigarettes  . Smokeless tobacco: Never Used  Substance Use Topics  . Alcohol use: No  . Drug use: No    Review of Systems Constitutional: Negative for fever ENT: Positive for congestion Cardiovascular: Right-sided chest pain when coughing. Respiratory: Positive for shortness of breath and cough.  Positive for wheeze Gastrointestinal: Negative for abdominal pain Musculoskeletal: Right chest wall pain Neurological: Negative for headache All other ROS negative  ____________________________________________   PHYSICAL EXAM:  VITAL SIGNS: ED Triage Vitals [05/25/17 1722]  Enc Vitals Group  BP (!) 143/82     Pulse Rate (!) 115     Resp 18     Temp 98.6 F (37 C)     Temp Source Oral     SpO2 92 %     Weight 160 lb (72.6 kg)     Height 5\' 2"  (1.575 m)     Head Circumference      Peak Flow      Pain Score      Pain Loc      Pain Edu?      Excl. in GC?    Constitutional: Alert and oriented. Well appearing and in no distress. Eyes: Normal exam ENT   Head: Normocephalic and atraumatic.   Mouth/Throat: Mucous membranes  are moist. Cardiovascular: Normal rate, regular rhythm around 100 bpm. Respiratory: Normal respiratory effort without noted tachypnea.  Patient does have moderate expiratory wheeze bilaterally. Gastrointestinal: Soft and nontender. No distention Musculoskeletal: Nontender with normal range of motion in all extremities. No lower extremity tenderness or edema. Neurologic:  Normal speech and language. No gross focal neurologic deficits  Skin:  Skin is warm, dry and intact.  Psychiatric: Mood and affect are normal.  ____________________________________________    EKG  EKG reviewed and interpreted by myself shows sinus tachycardia at 106 bpm with a narrow QRS, normal axis, normal intervals, nonspecific ST changes without ST elevation.  ____________________________________________    RADIOLOGY  Negative chest x-ray  ____________________________________________   INITIAL IMPRESSION / ASSESSMENT AND PLAN / ED COURSE  Pertinent labs & imaging results that were available during my care of the patient were reviewed by me and considered in my medical decision making (see chart for details).  Patient presents to the emergency department for cough and shortness of breath.  Overall the patient appears well, heart rate currently around 90 bpm satting 95% on room air.  Patient does have diffuse expiratory wheeze on exam.  Differential would include COPD exacerbation, reactive airway disease, upper respiratory infection, pneumonia.  Patient does have right chest wall pain reproducible with palpation or cough.  Highly suspect chest wall strain due to coughing.  We will treat with DuoNeb's, prednisone and treat with Tussionex while in the emergency department.  Highly suspect COPD exacerbation exacerbated likely by upper respiratory infection.  As the patient has been coughing for greater than 1 week and has COPD we will cover with antibiotics, Zithromax we will write for a prednisone taper and prescribe  an expectorant such as guaifenesin.  Patient's labs have resulted largely within normal limits including a negative troponin and a normal white blood cell count.  Chest x-ray is negative for pneumonia.  Patient is desatted currently 89 and 90% on room air in her bed.  Attempted to ambulate saturation remains around 89-90% and her heart rate increases to 150.  Highly suspect COPD exacerbation given her lung sounds and history.  We will dose antibiotics to cover for possible infection we will dose IV fluids and admit to the hospital for further treatment and workup. ____________________________________________   FINAL CLINICAL IMPRESSION(S) / ED DIAGNOSES  COPD exacerbation Upper respiratory infection    Minna AntisPaduchowski, Ahyana Skillin, MD 05/25/17 2010    Minna AntisPaduchowski, Craig Wisnewski, MD 05/25/17 2010    Minna AntisPaduchowski, Derwood Becraft, MD 05/25/17 2014

## 2017-05-25 NOTE — ED Triage Notes (Signed)
Pt came to ed via pov c/o sob. Cough, congestion, right sided chest pain. Has been doing albuterol treatments at home with no relief. Hr 112

## 2017-05-25 NOTE — ED Notes (Signed)
Patient's oxygen saturation at 90-91% on RA. Patient placed on 1L [place]. RN will continue to monitor.

## 2017-05-25 NOTE — ED Notes (Signed)
Patient's oxygen saturation level 95% on 1L [place]. RN will continue to monitor.

## 2017-05-25 NOTE — ED Notes (Signed)
Ambulated patient per MD order. Patient's oxygen saturation dropped from 94% to 90%. Patient's heart rate increased from low 90s (bpm) to 140s (bpm). Patient's respiratory rate increased from 18 to 30.  Patient began to have multiple PVCs. Patient began to become dyspneic, with audible wheezing and labored breathing. MD Paduchowski informed.

## 2017-05-25 NOTE — ED Notes (Signed)
Called pharmacy, spoke with pharmacist about rescheduling azithromycin IV as PO dose has already been given. Pharmacist reported she would reschedule dose when medication is verified.

## 2017-05-26 LAB — LIPID PANEL
CHOLESTEROL: 237 mg/dL — AB (ref 0–200)
HDL: 49 mg/dL (ref >40)
LDL Cholesterol: 167 mg/dL — ABNORMAL HIGH (ref 0–99)
TRIGLYCERIDES: 106 mg/dL (ref <150)
Total CHOL/HDL Ratio: 4.8 RATIO
VLDL: 21 mg/dL (ref 0–40)

## 2017-05-26 LAB — EXPECTORATED SPUTUM ASSESSMENT W REFEX TO RESP CULTURE

## 2017-05-26 LAB — EXPECTORATED SPUTUM ASSESSMENT W GRAM STAIN, RFLX TO RESP C

## 2017-05-26 MED ORDER — HYDROCOD POLST-CPM POLST ER 10-8 MG/5ML PO SUER
5.0000 mL | Freq: Two times a day (BID) | ORAL | Status: DC
  Administered 2017-05-26 – 2017-05-28 (×5): 5 mL via ORAL
  Filled 2017-05-26 (×5): qty 5

## 2017-05-26 MED ORDER — NICOTINE 21 MG/24HR TD PT24
21.0000 mg | MEDICATED_PATCH | Freq: Every day | TRANSDERMAL | Status: DC
  Administered 2017-05-26 – 2017-05-27 (×2): 21 mg via TRANSDERMAL
  Filled 2017-05-26 (×2): qty 1

## 2017-05-26 MED ORDER — CYCLOBENZAPRINE HCL 10 MG PO TABS
10.0000 mg | ORAL_TABLET | Freq: Three times a day (TID) | ORAL | Status: DC
  Administered 2017-05-26 (×3): 10 mg via ORAL
  Filled 2017-05-26 (×3): qty 1

## 2017-05-26 NOTE — Plan of Care (Signed)
Pt is progressing. Pt still requiring 2L O2 acute. Pt is still coughing frequently but states it is getting better.

## 2017-05-26 NOTE — Clinical Social Work Note (Signed)
CSW received consult that the patient needs medication assistance. CSW has informed the RNCM of the consult. CSW is signing off.  Argentina PonderKaren Martha Rendy Lazard, MSW, Theresia MajorsLCSWA [phone]

## 2017-05-26 NOTE — Progress Notes (Signed)
[facility] [place] [facility]   PATIENT NAME: Karen HeirKaren Russell    MR#:  [id number]  DATE OF BIRTH:  02-Nov-1975  SUBJECTIVE:  CHIEF COMPLAINT:   Chief Complaint  Patient presents with  . Shortness of Breath   - Repeat admission for COPD exacerbation and bronchitis. -Complains of significant wheezing, cough and chest pain and coughing.  REVIEW OF SYSTEMS:  Review of Systems  Constitutional: Positive for malaise/fatigue. Negative for chills and fever.  HENT: Negative for congestion, ear discharge, hearing loss and nosebleeds.   Eyes: Negative for blurred vision, double vision and photophobia.  Respiratory: Positive for cough, shortness of breath and wheezing.   Cardiovascular: Positive for chest pain. Negative for palpitations.  Gastrointestinal: Negative for abdominal pain, constipation, diarrhea, nausea and vomiting.  Genitourinary: Negative for dysuria.  Musculoskeletal: Positive for myalgias.  Neurological: Negative for dizziness, speech change, focal weakness, seizures and headaches.  Psychiatric/Behavioral: Negative for depression.    DRUG ALLERGIES:  No Known Allergies  VITALS:  Blood pressure 113/76, pulse 84, temperature (!) 97.5 F (36.4 C), temperature source Oral, resp. rate 18, height 5\' 2"  (1.575 m), weight 72.6 kg (160 lb), last menstrual period 05/25/2017, SpO2 93 %.  PHYSICAL EXAMINATION:  Physical Exam  GENERAL:  41 y.o.-year-old patient lying in the bed with no acute distress.  EYES: Pupils equal, round, reactive to light and accommodation. No scleral icterus. Extraocular muscles intact.  HEENT: Head atraumatic, normocephalic. Oropharynx and nasopharynx clear.  NECK:  Supple, no jugular venous distention. No thyroid enlargement, no tenderness.  LUNGS: Diffuse expiratory wheezing all over the lung fields, no rales, rhonchi or crepitation. No use of accessory muscles of respiration.  CARDIOVASCULAR: S1, S2 normal. No murmurs, rubs, or  gallops.  ABDOMEN: Soft, nontender, nondistended. Bowel sounds present. No organomegaly or mass.  EXTREMITIES: No pedal edema, cyanosis, or clubbing.  NEUROLOGIC: Cranial nerves II through XII are intact. Muscle strength 5/5 in all extremities. Sensation intact. Gait not checked.  PSYCHIATRIC: The patient is alert and oriented x 3.  SKIN: No obvious rash, lesion, or ulcer.    LABORATORY PANEL:   CBC Recent Labs  Lab 05/25/17 1726  WBC 7.8  HGB 14.5  HCT 42.2  PLT 333   ------------------------------------------------------------------------------------------------------------------  Chemistries  Recent Labs  Lab 05/25/17 1726  NA 138  K 3.6  CL 107  CO2 23  GLUCOSE 120*  BUN 6  CREATININE 0.86  CALCIUM 9.1   ------------------------------------------------------------------------------------------------------------------  Cardiac Enzymes Recent Labs  Lab 05/25/17 1726  TROPONINI <0.03   ------------------------------------------------------------------------------------------------------------------  RADIOLOGY:  Dg Chest 2 View  Result Date: 05/25/2017 CLINICAL DATA:  Cough, shortness of breath EXAM: CHEST  2 VIEW COMPARISON:  05/05/2017 FINDINGS: The heart size and mediastinal contours are within normal limits. Both lungs are clear. The visualized skeletal structures are unremarkable. IMPRESSION: No active cardiopulmonary disease. Electronically Signed   By: Elige KoHetal  Patel   On: 05/25/2017 17:45    EKG:   Orders placed or performed during the hospital encounter of 05/25/17  . EKG 12-Lead  . EKG 12-Lead  . ED EKG within 10 minutes  . ED EKG within 10 minutes    ASSESSMENT AND PLAN:   41 year old female with ongoing smoking, fibromyalgia, hypertension and COPD not on home oxygen presents to hospital secondary to worsening shortness of breath.  #1 acute on chronic COPD exacerbation with acute bronchitis-currently requiring 2 L oxygen. -Continue IV  steroids, nebulizer treatments, Pulmicort twice a day. -Continue 2 L oxygen support -  On cough medicines. Muscle relaxants -On Rocephin and azithromycin for her bronchitis -Chest x-ray is clear. strongly advised against smoking  2. Neuropathy-continue gabapentin  3. DVT prophylaxis-Lovenox  4. Tobacco use disorder-strongly counseled again.   All the records are reviewed and case discussed with Care Management/Social Workerr. Management plans discussed with the patient, family and they are in agreement.  CODE STATUS: Full code  TOTAL TIME TAKING CARE OF THIS PATIENT: 38 minutes.   POSSIBLE D/C IN 2-3 DAYS, DEPENDING ON CLINICAL CONDITION.   [name],[name] M.D on 05/26/2017 at 10:42 AM  Between 7am to 6pm - Pager - [phone]  After 6pm go to [email or web address] - Social research officer, governmentpassword EPAS [email or web address]  [facility] [place] [facility]  Office  [phone]  CC: Primary care physician; Evelene CroonNiemeyer, Meindert, MD

## 2017-05-27 LAB — BASIC METABOLIC PANEL
ANION GAP: 10 (ref 5–15)
BUN: 16 mg/dL (ref 6–20)
CHLORIDE: 107 mmol/L (ref 101–111)
CO2: 23 mmol/L (ref 22–32)
Calcium: 9 mg/dL (ref 8.9–10.3)
Creatinine, Ser: 0.78 mg/dL (ref 0.44–1.00)
GFR calc Af Amer: >60 mL/min (ref >60)
GLUCOSE: 128 mg/dL — AB (ref 65–99)
POTASSIUM: 3.9 mmol/L (ref 3.5–5.1)
Sodium: 140 mmol/L (ref 135–145)

## 2017-05-27 LAB — HIV ANTIBODY (ROUTINE TESTING W REFLEX): HIV Screen 4th Generation wRfx: NONREACTIVE

## 2017-05-27 LAB — CBC
HEMATOCRIT: 42 % (ref 35.0–47.0)
HEMOGLOBIN: 13.9 g/dL (ref 12.0–16.0)
MCH: 31.1 pg (ref 26.0–34.0)
MCHC: 33.1 g/dL (ref 32.0–36.0)
MCV: 94 fL (ref 80.0–100.0)
Platelets: 360 10*3/uL (ref 150–440)
RBC: 4.46 MIL/uL (ref 3.80–5.20)
RDW: 13.7 % (ref 11.5–14.5)
WBC: 13.7 10*3/uL — AB (ref 3.6–11.0)

## 2017-05-27 MED ORDER — BENZONATATE 100 MG PO CAPS
200.0000 mg | ORAL_CAPSULE | Freq: Three times a day (TID) | ORAL | Status: DC
  Administered 2017-05-27 – 2017-05-28 (×4): 200 mg via ORAL
  Filled 2017-05-27 (×4): qty 2

## 2017-05-27 MED ORDER — IPRATROPIUM-ALBUTEROL 0.5-2.5 (3) MG/3ML IN SOLN
RESPIRATORY_TRACT | Status: AC
  Administered 2017-05-27: 3 mL
  Filled 2017-05-27: qty 3

## 2017-05-27 MED ORDER — LIDOCAINE 5 % EX PTCH
1.0000 | MEDICATED_PATCH | CUTANEOUS | Status: DC
  Administered 2017-05-27: 1 via TRANSDERMAL
  Filled 2017-05-27 (×2): qty 1

## 2017-05-27 MED ORDER — CYCLOBENZAPRINE HCL 10 MG PO TABS
10.0000 mg | ORAL_TABLET | Freq: Three times a day (TID) | ORAL | Status: DC
  Administered 2017-05-27 – 2017-05-28 (×4): 10 mg via ORAL
  Filled 2017-05-27 (×4): qty 1

## 2017-05-27 NOTE — Progress Notes (Signed)
[facility] - [place] at [facility]   PATIENT NAME: Karen HeirKaren Russell    MR#:  [id number]  DATE OF BIRTH:  Jun 15, 1975  SUBJECTIVE:  CHIEF COMPLAINT:   Chief Complaint  Patient presents with  . Shortness of Breath   - Repeat admission for COPD exacerbation and bronchitis. -Feels that her breathing is getting better. Still remains on 2 L oxygen. Complains of cough and right-sided muscular chest pain.  REVIEW OF SYSTEMS:  Review of Systems  Constitutional: Positive for malaise/fatigue. Negative for chills and fever.  HENT: Negative for congestion, ear discharge, hearing loss and nosebleeds.   Eyes: Negative for blurred vision, double vision and photophobia.  Respiratory: Positive for cough, shortness of breath and wheezing.   Cardiovascular: Positive for chest pain. Negative for palpitations.  Gastrointestinal: Negative for abdominal pain, constipation, diarrhea, nausea and vomiting.  Genitourinary: Negative for dysuria.  Musculoskeletal: Positive for myalgias.  Neurological: Negative for dizziness, speech change, focal weakness, seizures and headaches.  Psychiatric/Behavioral: Negative for depression.    DRUG ALLERGIES:  No Known Allergies  VITALS:  Blood pressure 113/65, pulse 96, temperature (!) 97.5 F (36.4 C), temperature source Oral, resp. rate 16, height 5\' 2"  (1.575 m), weight 72.6 kg (160 lb), last menstrual period 05/25/2017, SpO2 97 %.  PHYSICAL EXAMINATION:  Physical Exam  GENERAL:  41 y.o.-year-old patient lying in the bed with no acute distress.  EYES: Pupils equal, round, reactive to light and accommodation. No scleral icterus. Extraocular muscles intact.  HEENT: Head atraumatic, normocephalic. Oropharynx and nasopharynx clear.  NECK:  Supple, no jugular venous distention. No thyroid enlargement, no tenderness.  LUNGS:improved expiratory wheezing all over the lung fields, no rales, rhonchi or crepitation. No use of accessory muscles of  respiration.  CARDIOVASCULAR: S1, S2 normal. No murmurs, rubs, or gallops.  ABDOMEN: Soft, nontender, nondistended. Bowel sounds present. No organomegaly or mass.  EXTREMITIES: No pedal edema, cyanosis, or clubbing.  NEUROLOGIC: Cranial nerves II through XII are intact. Muscle strength 5/5 in all extremities. Sensation intact. Gait not checked.  PSYCHIATRIC: The patient is alert and oriented x 3.  SKIN: No obvious rash, lesion, or ulcer.    LABORATORY PANEL:   CBC Recent Labs  Lab 05/27/17 0350  WBC 13.7*  HGB 13.9  HCT 42.0  PLT 360   ------------------------------------------------------------------------------------------------------------------  Chemistries  Recent Labs  Lab 05/27/17 0350  NA 140  K 3.9  CL 107  CO2 23  GLUCOSE 128*  BUN 16  CREATININE 0.78  CALCIUM 9.0   ------------------------------------------------------------------------------------------------------------------  Cardiac Enzymes Recent Labs  Lab 05/25/17 1726  TROPONINI <0.03   ------------------------------------------------------------------------------------------------------------------  RADIOLOGY:  Dg Chest 2 View  Result Date: 05/25/2017 CLINICAL DATA:  Cough, shortness of breath EXAM: CHEST  2 VIEW COMPARISON:  05/05/2017 FINDINGS: The heart size and mediastinal contours are within normal limits. Both lungs are clear. The visualized skeletal structures are unremarkable. IMPRESSION: No active cardiopulmonary disease. Electronically Signed   By: Elige KoHetal  Patel   On: 05/25/2017 17:45    EKG:   Orders placed or performed during the hospital encounter of 05/25/17  . EKG 12-Lead  . EKG 12-Lead    ASSESSMENT AND PLAN:   41 year old female with ongoing smoking, fibromyalgia, hypertension and COPD not on home oxygen presents to hospital secondary to worsening shortness of breath.  #1 acute on chronic COPD exacerbation with acute bronchitis-currently requiring 2 L oxygen. -Continue  IV steroids today, nebulizer treatments, Pulmicort twice a day. -Continue 2 L oxygen support-wean off oxygen today. -  On cough medicines. Muscle relaxants, Lidoderm patch -On Rocephin and azithromycin for her bronchitis -Chest x-ray is clear. strongly advised against smoking  2. Neuropathy-continue gabapentin  3. DVT prophylaxis-Lovenox  4. Tobacco use disorder-strongly counseled again. Started on nicotine patch   All the records are reviewed and case discussed with Care Management/Social Workerr. Management plans discussed with the patient, family and they are in agreement.  CODE STATUS: Full code  TOTAL TIME TAKING CARE OF THIS PATIENT: 29 minutes.   POSSIBLE D/C TOMORROW, DEPENDING ON CLINICAL CONDITION.   [name],Kymberly Blomberg M.D on 05/27/2017 at 9:04 AM  Between 7am to 6pm - Pager - [phone]  After 6pm go to [email or web address] - Social research officer, governmentpassword EPAS [email or web address]  [facility] [place] [facility]  Office  [phone]  CC: Primary care physician; Evelene CroonNiemeyer, Meindert, MD

## 2017-05-27 NOTE — Plan of Care (Signed)
Patient is progressing with minimal cough and congestion.  Karen Russell, Karen Russell N

## 2017-05-27 NOTE — Plan of Care (Signed)
Pt is progressing. Pt states her coughing has decreased and she feels like she is improving.

## 2017-05-28 LAB — CULTURE, RESPIRATORY W GRAM STAIN: Culture: NORMAL

## 2017-05-28 LAB — CULTURE, RESPIRATORY

## 2017-05-28 MED ORDER — HYDROCODONE-ACETAMINOPHEN 5-325 MG PO TABS: 1.0000 | ORAL_TABLET | Freq: Four times a day (QID) | ORAL | 0 refills | Status: DC | PRN

## 2017-05-28 MED ORDER — PREDNISONE 10 MG (21) PO TBPK: ORAL_TABLET | ORAL | 0 refills | Status: DC

## 2017-05-28 MED ORDER — HYDROCOD POLST-CPM POLST ER 10-8 MG/5ML PO SUER: 5.0000 mL | Freq: Two times a day (BID) | ORAL | 0 refills | Status: DC

## 2017-05-28 MED ORDER — IPRATROPIUM-ALBUTEROL 0.5-2.5 (3) MG/3ML IN SOLN: 3.0000 mL | Freq: Three times a day (TID) | RESPIRATORY_TRACT | Status: DC

## 2017-05-28 MED ORDER — CYCLOBENZAPRINE HCL 10 MG PO TABS: 10.0000 mg | ORAL_TABLET | Freq: Three times a day (TID) | ORAL | 0 refills | Status: DC

## 2017-05-28 MED ORDER — IPRATROPIUM-ALBUTEROL 0.5-2.5 (3) MG/3ML IN SOLN: 3.0000 mL | RESPIRATORY_TRACT | Status: DC | PRN

## 2017-05-28 MED ORDER — BENZONATATE 200 MG PO CAPS: 200.0000 mg | ORAL_CAPSULE | Freq: Three times a day (TID) | ORAL | 0 refills | Status: DC

## 2017-05-28 MED ORDER — ATENOLOL 25 MG PO TABS: 25.0000 mg | ORAL_TABLET | Freq: Every day | ORAL | 1 refills | Status: DC

## 2017-05-28 MED ORDER — GUAIFENESIN ER 600 MG PO TB12: 600.0000 mg | ORAL_TABLET | Freq: Two times a day (BID) | ORAL | 0 refills | Status: DC

## 2017-05-28 MED ORDER — AZITHROMYCIN 500 MG PO TABS: 500.0000 mg | ORAL_TABLET | Freq: Every day | ORAL | 0 refills | Status: AC

## 2017-05-28 NOTE — Discharge Summary (Signed)
[facility] - [place] at [facility]   PATIENT NAME: Karen HeirKaren Russell    MR#:  [id number]  DATE OF BIRTH:  1975/06/16  DATE OF ADMISSION:  05/25/2017   ADMITTING PHYSICIAN: Bertrum SolMontell D Salary, MD  DATE OF DISCHARGE: 05/28/2017 12:52 PM  PRIMARY CARE PHYSICIAN: Evelene CroonNiemeyer, Meindert, MD   ADMISSION DIAGNOSIS:   COPD exacerbation (HCC) [J44.1] Upper respiratory tract infection, unspecified type [J06.9]  DISCHARGE DIAGNOSIS:   Active Problems:   COPD exacerbation (HCC)   SECONDARY DIAGNOSIS:   Past Medical History:  Diagnosis Date  . Arthritis   . COPD (chronic obstructive pulmonary disease) (HCC)   . Fibromyalgia syndrome   . Hyperlipemia   . Hypertension     HOSPITAL COURSE:   41 year old female with ongoing smoking, fibromyalgia, hypertension and COPD not on home oxygen presents to hospital secondary to worsening shortness of breath.  #1 acute on chronic COPD exacerbation with acute bronchitis-currently requiring 2 L oxygen. -received IV steroids, nebulizer treatments, Pulmicort twice a day. -was needing  2 L oxygen support-weaned off oxygen and ambulating well. -On cough medicines. Muscle relaxants -On Rocephin and azithromycin for her bronchitis- change to oral azithromycin at discharge -- prednisone discharge -Chest x-ray is clear. strongly advised against smoking  2. Neuropathy-continue gabapentin  3. Tobacco use disorder-strongly counseled again. was on nicotine patch in the hospital  Feels better and will be discharged today    DISCHARGE CONDITIONS:   Guarded  CONSULTS OBTAINED:   None  DRUG ALLERGIES:   No Known Allergies DISCHARGE MEDICATIONS:   Allergies as of 05/28/2017   No Known Allergies     Medication List    TAKE these medications   albuterol (2.5 MG/3ML) 0.083% nebulizer solution Commonly known as:  PROVENTIL Take 3 mLs (2.5 mg total) by nebulization every 6 (six) hours as needed for wheezing or shortness of  breath.   atenolol 25 MG tablet Commonly known as:  TENORMIN Take 1 tablet (25 mg total) by mouth daily.   azithromycin 500 MG tablet Commonly known as:  ZITHROMAX Take 1 tablet (500 mg total) by mouth daily for 3 days. Take 1 tablet daily for 3 days.   benzonatate 200 MG capsule Commonly known as:  TESSALON Take 1 capsule (200 mg total) by mouth 3 (three) times daily.   chlorpheniramine-HYDROcodone 10-8 MG/5ML Suer Commonly known as:  TUSSIONEX Take 5 mLs by mouth every 12 (twelve) hours.   cyclobenzaprine 10 MG tablet Commonly known as:  FLEXERIL Take 1 tablet (10 mg total) by mouth 3 (three) times daily.   gabapentin 400 MG capsule Commonly known as:  NEURONTIN Take 400 mg by mouth 3 (three) times daily.   guaiFENesin 600 MG 12 hr tablet Commonly known as:  MUCINEX Take 1 tablet (600 mg total) by mouth 2 (two) times daily.   HYDROcodone-acetaminophen 5-325 MG tablet Commonly known as:  NORCO/VICODIN Take 1 tablet by mouth every 6 (six) hours as needed for moderate pain or severe pain.   meloxicam 7.5 MG tablet Commonly known as:  MOBIC Take 7.5 mg by mouth 2 (two) times daily.   predniSONE 10 MG (21) Tbpk tablet Commonly known as:  STERAPRED UNI-PAK 21 TAB 6 tabs PO x 2 days 5 tabs PO x 2 days 4 tabs PO x 2 days 3 tabs PO x 2 days 2 tabs PO x 2 days 1 tab PO x 2 days and stop        DISCHARGE INSTRUCTIONS:   1. PCP f/u in  1-2 weeks 2. Smoking cessation recommended  DIET:   Regular diet  ACTIVITY:   Activity as tolerated  OXYGEN:   Home Oxygen: No.  Oxygen Delivery: room air  DISCHARGE LOCATION:   home   If you experience worsening of your admission symptoms, develop shortness of breath, life threatening emergency, suicidal or homicidal thoughts you must seek medical attention immediately by calling 911 or calling your MD immediately  if symptoms less severe.  You Must read complete instructions/literature along with all the possible  adverse reactions/side effects for all the Medicines you take and that have been prescribed to you. Take any new Medicines after you have completely understood and accpet all the possible adverse reactions/side effects.   Please note  You were cared for by a hospitalist during your hospital stay. If you have any questions about your discharge medications or the care you received while you were in the hospital after you are discharged, you can call the unit and asked to speak with the hospitalist on call if the hospitalist that took care of you is not available. Once you are discharged, your primary care physician will handle any further medical issues. Please note that NO REFILLS for any discharge medications will be authorized once you are discharged, as it is imperative that you return to your primary care physician (or establish a relationship with a primary care physician if you do not have one) for your aftercare needs so that they can reassess your need for medications and monitor your lab values.    On the day of Discharge:  VITAL SIGNS:   Blood pressure 110/73, pulse 91, temperature 97.7 F (36.5 C), temperature source Oral, resp. rate 20, height 5\' 2"  (1.575 m), weight 72.6 kg (160 lb), last menstrual period 05/25/2017, SpO2 92 %.  PHYSICAL EXAMINATION:    GENERAL:  41 y.o.-year-old patient lying in the bed with no acute distress.  EYES: Pupils equal, round, reactive to light and accommodation. No scleral icterus. Extraocular muscles intact.  HEENT: Head atraumatic, normocephalic. Oropharynx and nasopharynx clear.  NECK:  Supple, no jugular venous distention. No thyroid enlargement, no tenderness.  LUNGS:much improved wheezing all over the lung fields, no rales, rhonchi or crepitation. No use of accessory muscles of respiration.  CARDIOVASCULAR: S1, S2 normal. No murmurs, rubs, or gallops.  ABDOMEN: Soft, nontender, nondistended. Bowel sounds present. No organomegaly or mass.    EXTREMITIES: No pedal edema, cyanosis, or clubbing.  NEUROLOGIC: Cranial nerves II through XII are intact. Muscle strength 5/5 in all extremities. Sensation intact. Gait not checked.  PSYCHIATRIC: The patient is alert and oriented x 3.  SKIN: No obvious rash, lesion, or ulcer.   Marland Kitchen   DATA REVIEW:   CBC Recent Labs  Lab 05/27/17 0350  WBC 13.7*  HGB 13.9  HCT 42.0  PLT 360    Chemistries  Recent Labs  Lab 05/27/17 0350  NA 140  K 3.9  CL 107  CO2 23  GLUCOSE 128*  BUN 16  CREATININE 0.78  CALCIUM 9.0     Microbiology Results  Results for orders placed or performed during the hospital encounter of 05/25/17  Culture, expectorated sputum-assessment     Status: None   Collection Time: 05/26/17  4:20 AM  Result Value Ref Range Status   Specimen Description EXPECTORATED SPUTUM  Final   Special Requests NONE  Final   Sputum evaluation   Final    Sputum specimen not acceptable for testing.  Please recollect.   CALLED TO  [facility] @ [phone] ON 05/26/2017 BY CAF Performed at [facility], [address] [place], [place] [address]    Report Status 05/26/2017 FINAL  Final  Culture, expectorated sputum-assessment     Status: None   Collection Time: 05/26/17  8:25 AM  Result Value Ref Range Status   Specimen Description SPU  Final   Special Requests NONE  Final   Sputum evaluation   Final    THIS SPECIMEN IS ACCEPTABLE FOR SPUTUM CULTURE Performed at [facility], [address] [place], [place] [address]    Report Status 05/26/2017 FINAL  Final  Culture, respiratory (NON-Expectorated)     Status: None (Preliminary result)   Collection Time: 05/26/17  8:25 AM  Result Value Ref Range Status   Specimen Description   Final    SPU Performed at [facility], [address] [place], [place] [address]    Special Requests   Final    NONE Reflexed from [id number] Performed at [facility], [address] [place], [place] [address]     Gram Stain   Final    FEW WBC PRESENT,BOTH PMN AND MONONUCLEAR RARE SQUAMOUS EPITHELIAL CELLS PRESENT FEW GRAM POSITIVE COCCI IN PAIRS RARE GRAM NEGATIVE RODS    Culture   Final    FEW Consistent with normal respiratory flora. Performed at [facility], [address] [place], [place] [address]    Report Status PENDING  Incomplete    RADIOLOGY:  No results found.   Management plans discussed with the patient, family and they are in agreement.  CODE STATUS:     Code Status Orders  (From admission, onward)        Start     Ordered   05/25/17 2150  Full code  Continuous     05/25/17 2149    Code Status History    Date Active Date Inactive Code Status Order ID Comments User Context   08/10/2016 18:21 08/13/2016 17:38 Full Code [id number]  Enedina FinnerPatel, Sona, MD Inpatient      TOTAL TIME TAKING CARE OF THIS PATIENT:38 minutes.    [name],Dakoda Bassette M.D on 05/28/2017 at 1:21 PM  Between 7am to 6pm - Pager - [phone]  After 6pm go to [email or web address] - Social research officer, governmentpassword EPAS [facility]  [facility]  Office  [phone]  CC: Primary care physician; Evelene CroonNiemeyer, Meindert, MD   Note: This dictation was prepared with Dragon dictation along with smaller phrase technology. Any transcriptional errors that result from this process are unintentional.

## 2017-05-28 NOTE — Plan of Care (Signed)
Pt progressed to RA during the night. Tolerating ok. Kalisetti said pt will d/c today

## 2017-05-28 NOTE — Care Management (Signed)
Patient to discharge today.  Admitted with COPD exacerbation.  Patient has inhalers at home.  Patient states that she is in the process of getting approved for disability.  [facility]  [facility] [address]  Patient provided with application to Medication Management , [employer], and "The Network:  Your Guide to [employer] [facility] [employer] [facility] [place]"  Booklet.  RNCM signing off.

## 2017-05-28 NOTE — Progress Notes (Signed)
Karen FindersKaren B Russell to be D/C'd Home per MD order.  Discussed prescriptions and follow up appointments with the Russell. Prescriptions given to Russell, medication list explained in detail. Pt verbalized understanding.  Allergies as of 05/28/2017   No Known Allergies     Medication List    TAKE these medications   albuterol (2.5 MG/3ML) 0.083% nebulizer solution Commonly known as:  PROVENTIL Take 3 mLs (2.5 mg total) by nebulization every 6 (six) hours as needed for wheezing or shortness of breath.   atenolol 25 MG tablet Commonly known as:  TENORMIN Take 1 tablet (25 mg total) by mouth daily.   azithromycin 500 MG tablet Commonly known as:  ZITHROMAX Take 1 tablet (500 mg total) by mouth daily for 3 days. Take 1 tablet daily for 3 days.   benzonatate 200 MG capsule Commonly known as:  TESSALON Take 1 capsule (200 mg total) by mouth 3 (three) times daily.   chlorpheniramine-HYDROcodone 10-8 MG/5ML Suer Commonly known as:  TUSSIONEX Take 5 mLs by mouth every 12 (twelve) hours.   cyclobenzaprine 10 MG tablet Commonly known as:  FLEXERIL Take 1 tablet (10 mg total) by mouth 3 (three) times daily.   gabapentin 400 MG capsule Commonly known as:  NEURONTIN Take 400 mg by mouth 3 (three) times daily.   guaiFENesin 600 MG 12 hr tablet Commonly known as:  MUCINEX Take 1 tablet (600 mg total) by mouth 2 (two) times daily.   HYDROcodone-acetaminophen 5-325 MG tablet Commonly known as:  NORCO/VICODIN Take 1 tablet by mouth every 6 (six) hours as needed for moderate pain or severe pain.   meloxicam 7.5 MG tablet Commonly known as:  MOBIC Take 7.5 mg by mouth 2 (two) times daily.   predniSONE 10 MG (21) Tbpk tablet Commonly known as:  STERAPRED UNI-PAK 21 TAB 6 tabs PO x 2 days 5 tabs PO x 2 days 4 tabs PO x 2 days 3 tabs PO x 2 days 2 tabs PO x 2 days 1 tab PO x 2 days and stop       Vitals:   05/28/17 0714 05/28/17 0854  BP:    Pulse: 91   Resp:    Temp:    SpO2: 91%  92%    Skin clean, dry and intact without evidence of skin break down, no evidence of skin tears noted. IV catheter discontinued intact. Site without signs and symptoms of complications. Dressing and pressure applied. Pt denies pain at this time. No complaints noted.  An After Visit Summary was printed and given to the Russell. Russell escorted via WC, and D/C home via private auto.  Karen PatientJessica K Brinton Russell

## 2017-08-07 ENCOUNTER — Emergency Department: Payer: Self-pay

## 2017-08-07 ENCOUNTER — Other Ambulatory Visit: Payer: Self-pay

## 2017-08-07 ENCOUNTER — Emergency Department
Admission: EM | Admit: 2017-08-07 | Discharge: 2017-08-07 | Disposition: A | Discharge status: Home / Self Care | Payer: Self-pay | Attending: Emergency Medicine | Admitting: Emergency Medicine

## 2017-08-07 DIAGNOSIS — J449 Chronic obstructive pulmonary disease, unspecified: Secondary | ICD-10-CM | POA: Insufficient documentation

## 2017-08-07 DIAGNOSIS — Z79899 Other long term (current) drug therapy: Secondary | ICD-10-CM | POA: Insufficient documentation

## 2017-08-07 DIAGNOSIS — F17210 Nicotine dependence, cigarettes, uncomplicated: Secondary | ICD-10-CM | POA: Insufficient documentation

## 2017-08-07 DIAGNOSIS — I10 Essential (primary) hypertension: Secondary | ICD-10-CM | POA: Insufficient documentation

## 2017-08-07 DIAGNOSIS — J209 Acute bronchitis, unspecified: Secondary | ICD-10-CM | POA: Insufficient documentation

## 2017-08-07 MED ORDER — AZITHROMYCIN 250 MG PO TABS: ORAL_TABLET | ORAL | 0 refills | Status: DC

## 2017-08-07 MED ORDER — PREDNISONE 10 MG (21) PO TBPK: ORAL_TABLET | ORAL | 0 refills | Status: DC

## 2017-08-07 MED ORDER — IPRATROPIUM-ALBUTEROL 0.5-2.5 (3) MG/3ML IN SOLN: 3.0000 mL | Freq: Four times a day (QID) | RESPIRATORY_TRACT | 0 refills | Status: DC | PRN

## 2017-08-07 MED ORDER — IPRATROPIUM-ALBUTEROL 0.5-2.5 (3) MG/3ML IN SOLN
3.0000 mL | Freq: Once | RESPIRATORY_TRACT | Status: AC
  Administered 2017-08-07: 3 mL via RESPIRATORY_TRACT
  Filled 2017-08-07: qty 3

## 2017-08-07 MED ORDER — HYDROCOD POLST-CPM POLST ER 10-8 MG/5ML PO SUER: 5.0000 mL | Freq: Two times a day (BID) | ORAL | 0 refills | Status: DC | PRN

## 2017-08-07 NOTE — ED Triage Notes (Addendum)
To ER c/o cough x 1 week. Clear production. Pt alert and oriented X4, active, cooperative, pt in NAD. RR even and unlabored, color WNL.    No relief with OTC medications.

## 2017-08-07 NOTE — ED Notes (Signed)
See triage note  Presents with cough for about 1 week   States she has had subjective fever last week  States is prod  Voice is hoarse    Low grade fever on arrival

## 2017-08-07 NOTE — ED Provider Notes (Signed)
[facility] Emergency Department Provider Note  ____________________________________________   First MD Initiated Contact with Patient 08/07/17 1721     (approximate)  I have reviewed the triage vital signs and the nursing notes.   HISTORY  Chief Complaint Cough    HPI Karen Russell is a 42 y.o. female presents emergency department complaining of a cough for 1-2 weeks.  She states that the beginning she had fever, chills and body aches.  She has a history of COPD and has been feeling more short of breath.  She states that the mucus is mostly clear.  She just states she is concerned about the hoarse voice and the continued cough that is not clearing up with her home nebulizer treatments of albuterol.  Past Medical History:  Diagnosis Date  . Arthritis   . COPD (chronic obstructive pulmonary disease) (HCC)   . Fibromyalgia syndrome   . Hyperlipemia   . Hypertension     Patient Active Problem List   Diagnosis Date Noted  . COPD exacerbation (HCC) 05/25/2017  . Respiratory failure with hypoxia (HCC) 08/10/2016    Past Surgical History:  Procedure Laterality Date  . KIDNEY SURGERY    . TUBAL LIGATION      Prior to Admission medications   Medication Sig Start Date End Date Taking? Authorizing Provider  albuterol (PROVENTIL) (2.5 MG/3ML) 0.083% nebulizer solution Take 3 mLs (2.5 mg total) by nebulization every 6 (six) hours as needed for wheezing or shortness of breath. 03/16/17   Sharyn Creamer, MD  atenolol (TENORMIN) 25 MG tablet Take 1 tablet (25 mg total) by mouth daily. 05/28/17   Enid Baas, MD  azithromycin (ZITHROMAX Z-PAK) 250 MG tablet 2 pills today then 1 pill a day for 4 days 08/07/17   Sherrie Mustache Roselyn Bering, PA-C  chlorpheniramine-HYDROcodone [facility] PENNKINETIC ER) 10-8 MG/5ML SUER Take 5 mLs by mouth every 12 (twelve) hours as needed for cough. 08/07/17   Fisher, Roselyn Bering, PA-C  cyclobenzaprine (FLEXERIL) 10 MG tablet Take 1 tablet (10  mg total) by mouth 3 (three) times daily. 05/28/17   Enid Baas, MD  gabapentin (NEURONTIN) 400 MG capsule Take 400 mg by mouth 3 (three) times daily. 03/29/17   [provider]  HYDROcodone-acetaminophen (NORCO/VICODIN) 5-325 MG tablet Take 1 tablet by mouth every 6 (six) hours as needed for moderate pain or severe pain. 05/28/17   Enid Baas, MD  ipratropium-albuterol (DUONEB) 0.5-2.5 (3) MG/3ML SOLN Take 3 mLs by nebulization every 6 (six) hours as needed. 08/07/17   Fisher, Roselyn Bering, PA-C  meloxicam (MOBIC) 7.5 MG tablet Take 7.5 mg by mouth 2 (two) times daily. 04/20/17   [provider]  predniSONE (STERAPRED UNI-PAK 21 TAB) 10 MG (21) TBPK tablet Take 6 pills on day one then decrease by 1 pill each day 08/07/17   Faythe Ghee, PA-C    Allergies Patient has no known allergies.  No family history on file.  Social History Social History   Tobacco Use  . Smoking status: Current Every Day Smoker    Packs/day: 0.50    Types: Cigarettes  . Smokeless tobacco: Never Used  Substance Use Topics  . Alcohol use: No  . Drug use: No    Review of Systems  Constitutional: No fever/chills at this time, had fever and chills 1-2 weeks ago Eyes: No visual changes. ENT: No sore throat.  Positive for sinus congestion hoarse voice Respiratory: Positive cough Gastrointestinal: Denies vomiting or diarrhea Genitourinary: Negative for dysuria. Musculoskeletal:  Negative for back pain. Skin: Negative for rash.    ____________________________________________   PHYSICAL EXAM:  VITAL SIGNS: ED Triage Vitals [08/07/17 1701]  Enc Vitals Group     BP 117/69     Pulse Rate (!) 110     Resp 18     Temp 99 F (37.2 C)     Temp Source Oral     SpO2 95 %     Weight 160 lb (72.6 kg)     Height 5\' 2"  (1.575 m)     Head Circumference      Peak Flow      Pain Score      Pain Loc      Pain Edu?      Excl. in GC?     Constitutional: Alert and oriented. Well  appearing and in no acute distress. Eyes: Conjunctivae are normal.  Head: Atraumatic. Nose: No congestion/rhinnorhea. Mouth/Throat: Mucous membranes are moist  Throat is normal Cardiovascular: Normal rate, regular rhythm.  Heart sounds are normal  respiratory: Normal respiratory effort.  No retractions, diffuse wheezing in all lung fields, voice is hoarse GU: deferred Musculoskeletal: FROM all extremities, warm and well perfused Neurologic:  Normal speech and language.  Skin:  Skin is warm, dry and intact. No rash noted. Psychiatric: Mood and affect are normal. Speech and behavior are normal.  ____________________________________________   LABS (all labs ordered are listed, but only abnormal results are displayed)  Labs Reviewed - No data to display ____________________________________________   ____________________________________________  RADIOLOGY  Chest x-ray is negative for pneumonia.  ____________________________________________   PROCEDURES  Procedure(s) performed: DuoNeb Procedures    ____________________________________________   INITIAL IMPRESSION / ASSESSMENT AND PLAN / ED COURSE  Pertinent labs & imaging results that were available during my care of the patient were reviewed by me and considered in my medical decision making (see chart for details).  Patient is 42 year old female complaining of continued cough after flulike symptoms 1-2 weeks ago.  On physical exam patient has a hoarse voice, she has diffuse wheezing in all lung fields  Chest x-ray is negative for any obvious pneumonia. DuoNeb was given to the patient.  She has some relief after the nebulizer treatment Chest x-ray was discussed with the patient.  She was given a prescription for a Z-Pak, Sterapred 6-day Dosepak, DuoNeb Nebules, and Tussionex cough syrup.  Patient states she does not think she will be able to afford the Tussionex.  Explained her she should buy over-the-counter Delsym as  this may be less expensive.  Told her it was mostly reported that she get the antibiotic and the steroid.  She states she understands will comply with our instructions.  She is to follow-up with her regular doctor if not better in 3-5 days.  If she is worsening she should return to the emergency department.  Patient states she understands.  She was discharged in stable condition     As part of my medical decision making, I reviewed the following data within the electronic MEDICAL RECORD NUMBER Nursing notes reviewed and incorporated, Old chart reviewed, Radiograph reviewed chest x-ray is negative, Notes from prior ED visits and [place] Controlled Substance Database  ____________________________________________   FINAL CLINICAL IMPRESSION(S) / ED DIAGNOSES  Final diagnoses:  Acute bronchitis, unspecified organism      NEW MEDICATIONS STARTED DURING THIS VISIT:  Discharge Medication List as of 08/07/2017  6:24 PM    START taking these medications   Details  azithromycin (ZITHROMAX Z-PAK) 250 MG  tablet 2 pills today then 1 pill a day for 4 days, Print    ipratropium-albuterol (DUONEB) 0.5-2.5 (3) MG/3ML SOLN Take 3 mLs by nebulization every 6 (six) hours as needed., Starting Tue 08/07/2017, Print         Note:  This document was prepared using Dragon voice recognition software and may include unintentional dictation errors.    Faythe GheeFisher, Susan W, PA-C 08/07/17 2310    Phineas SemenGoodman, Graydon, MD 08/07/17 2325

## 2017-08-07 NOTE — Discharge Instructions (Signed)
Follow-up with your regular doctor not better in 3-5 days.  Your chest x-ray did not show any pneumonia today.  You have acute bronchitis.  Use the steroid pack as instructed.  Use the cough medication as instructed

## 2017-08-30 ENCOUNTER — Other Ambulatory Visit: Payer: Self-pay

## 2017-08-30 ENCOUNTER — Emergency Department: Payer: Medicaid Other

## 2017-08-30 ENCOUNTER — Emergency Department
Admission: EM | Admit: 2017-08-30 | Discharge: 2017-08-31 | Disposition: A | Discharge status: Home / Self Care | Payer: Medicaid Other | Attending: Emergency Medicine | Admitting: Emergency Medicine

## 2017-08-30 DIAGNOSIS — R059 Cough, unspecified: Secondary | ICD-10-CM

## 2017-08-30 DIAGNOSIS — F17210 Nicotine dependence, cigarettes, uncomplicated: Secondary | ICD-10-CM | POA: Insufficient documentation

## 2017-08-30 DIAGNOSIS — I10 Essential (primary) hypertension: Secondary | ICD-10-CM | POA: Insufficient documentation

## 2017-08-30 DIAGNOSIS — R05 Cough: Secondary | ICD-10-CM

## 2017-08-30 DIAGNOSIS — Z79899 Other long term (current) drug therapy: Secondary | ICD-10-CM | POA: Insufficient documentation

## 2017-08-30 DIAGNOSIS — J441 Chronic obstructive pulmonary disease with (acute) exacerbation: Secondary | ICD-10-CM | POA: Insufficient documentation

## 2017-08-30 LAB — BASIC METABOLIC PANEL
ANION GAP: 10 (ref 5–15)
BUN: 8 mg/dL (ref 6–20)
CHLORIDE: 106 mmol/L (ref 101–111)
CO2: 20 mmol/L — ABNORMAL LOW (ref 22–32)
Calcium: 9.2 mg/dL (ref 8.9–10.3)
Creatinine, Ser: 0.81 mg/dL (ref 0.44–1.00)
GFR calc non Af Amer: >60 mL/min (ref >60)
Glucose, Bld: 129 mg/dL — ABNORMAL HIGH (ref 65–99)
POTASSIUM: 3.6 mmol/L (ref 3.5–5.1)
SODIUM: 136 mmol/L (ref 135–145)

## 2017-08-30 LAB — CBC
HEMATOCRIT: 43.4 % (ref 35.0–47.0)
HEMOGLOBIN: 14.7 g/dL (ref 12.0–16.0)
MCH: 30.5 pg (ref 26.0–34.0)
MCHC: 33.8 g/dL (ref 32.0–36.0)
MCV: 90.2 fL (ref 80.0–100.0)
Platelets: 307 10*3/uL (ref 150–440)
RBC: 4.81 MIL/uL (ref 3.80–5.20)
RDW: 15.4 % — ABNORMAL HIGH (ref 11.5–14.5)
WBC: 11.7 10*3/uL — AB (ref 3.6–11.0)

## 2017-08-30 LAB — TROPONIN I: Troponin I: <0.03 ng/mL (ref <0.03)

## 2017-08-30 MED ORDER — BENZONATATE 100 MG PO CAPS: 100.0000 mg | ORAL_CAPSULE | Freq: Four times a day (QID) | ORAL | 0 refills | Status: DC | PRN

## 2017-08-30 MED ORDER — IPRATROPIUM-ALBUTEROL 0.5-2.5 (3) MG/3ML IN SOLN
3.0000 mL | Freq: Once | RESPIRATORY_TRACT | Status: AC
  Administered 2017-08-30: 3 mL via RESPIRATORY_TRACT

## 2017-08-30 MED ORDER — BENZONATATE 100 MG PO CAPS
ORAL_CAPSULE | ORAL | Status: AC
  Administered 2017-08-30: 200 mg via ORAL
  Filled 2017-08-30: qty 2

## 2017-08-30 MED ORDER — IPRATROPIUM-ALBUTEROL 0.5-2.5 (3) MG/3ML IN SOLN
RESPIRATORY_TRACT | Status: AC
  Administered 2017-08-30: 3 mL via RESPIRATORY_TRACT
  Filled 2017-08-30: qty 9

## 2017-08-30 MED ORDER — BENZONATATE 100 MG PO CAPS
200.0000 mg | ORAL_CAPSULE | Freq: Once | ORAL | Status: AC
Start: 2017-08-30 — End: 2017-08-30
  Administered 2017-08-30: 200 mg via ORAL

## 2017-08-30 MED ORDER — ACETYLCYSTEINE 20 % IN SOLN
4.0000 mL | Freq: Once | RESPIRATORY_TRACT | Status: AC
Start: 2017-08-30 — End: 2017-08-30
  Administered 2017-08-30: 4 mL via RESPIRATORY_TRACT
  Filled 2017-08-30: qty 4

## 2017-08-30 MED ORDER — PREDNISONE 20 MG PO TABS: ORAL_TABLET | ORAL | 0 refills | Status: DC

## 2017-08-30 MED ORDER — METHYLPREDNISOLONE SODIUM SUCC 125 MG IJ SOLR
INTRAMUSCULAR | Status: AC
  Filled 2017-08-30: qty 2

## 2017-08-30 MED ORDER — ALBUTEROL SULFATE (2.5 MG/3ML) 0.083% IN NEBU
5.0000 mg | INHALATION_SOLUTION | Freq: Once | RESPIRATORY_TRACT | Status: AC
  Administered 2017-08-30: 5 mg via RESPIRATORY_TRACT
  Filled 2017-08-30: qty 6

## 2017-08-30 NOTE — ED Notes (Signed)
Pt sitting in w/c in lobby with no distress noted; pt & SO updated on wait time & plan of care; congested cough noted; pt reports recently rx steroids and z-pak but cont to not feel well with prod cough clear sputum, SHOB and frequent coughing unrelieved by neb at home; hx COPD; vs retaken; O2 placed at 2l/min via [place] for sat of 90-92% on ra to bring sat to 95%; protocols reviewed

## 2017-08-30 NOTE — ED Provider Notes (Signed)
[facility] Emergency Department Provider Note  ____________________________________________   I have reviewed the triage vital signs and the nursing notes.   HISTORY  Chief Complaint Shortness of Breath and COPD   History limited by: Not Limited   HPI Karen Russell is a 42 y.o. female who presents to the emergency department today because of concerns for continued shortness of breath, cough.  The patient's symptoms have been present for over 1 month.  She states that she has had multiple courses of azithromycin as well as steroid course without relief. She states that the cough is worse at night. She has been using her home inhalers without any significant relief. She denies any chest pain. Denies any fever.    Per medical record review patient has a history of COPD  Past Medical History:  Diagnosis Date  . Arthritis   . COPD (chronic obstructive pulmonary disease) (HCC)   . Fibromyalgia syndrome   . Hyperlipemia   . Hypertension     Patient Active Problem List   Diagnosis Date Noted  . COPD exacerbation (HCC) 05/25/2017  . Respiratory failure with hypoxia (HCC) 08/10/2016    Past Surgical History:  Procedure Laterality Date  . KIDNEY SURGERY    . TUBAL LIGATION      Prior to Admission medications   Medication Sig Start Date End Date Taking? Authorizing Provider  albuterol (PROVENTIL) (2.5 MG/3ML) 0.083% nebulizer solution Take 3 mLs (2.5 mg total) by nebulization every 6 (six) hours as needed for wheezing or shortness of breath. 03/16/17   Sharyn Creamer, MD  atenolol (TENORMIN) 25 MG tablet Take 1 tablet (25 mg total) by mouth daily. 05/28/17   Enid Baas, MD  azithromycin (ZITHROMAX Z-PAK) 250 MG tablet 2 pills today then 1 pill a day for 4 days 08/07/17   Sherrie Mustache Roselyn Bering, PA-C  chlorpheniramine-HYDROcodone [facility] PENNKINETIC ER) 10-8 MG/5ML SUER Take 5 mLs by mouth every 12 (twelve) hours as needed for cough. 08/07/17   Fisher, Roselyn Bering, PA-C  cyclobenzaprine (FLEXERIL) 10 MG tablet Take 1 tablet (10 mg total) by mouth 3 (three) times daily. 05/28/17   Enid Baas, MD  gabapentin (NEURONTIN) 400 MG capsule Take 400 mg by mouth 3 (three) times daily. 03/29/17   [provider]  HYDROcodone-acetaminophen (NORCO/VICODIN) 5-325 MG tablet Take 1 tablet by mouth every 6 (six) hours as needed for moderate pain or severe pain. 05/28/17   Enid Baas, MD  ipratropium-albuterol (DUONEB) 0.5-2.5 (3) MG/3ML SOLN Take 3 mLs by nebulization every 6 (six) hours as needed. 08/07/17   Fisher, Roselyn Bering, PA-C  meloxicam (MOBIC) 7.5 MG tablet Take 7.5 mg by mouth 2 (two) times daily. 04/20/17   [provider]  predniSONE (STERAPRED UNI-PAK 21 TAB) 10 MG (21) TBPK tablet Take 6 pills on day one then decrease by 1 pill each day 08/07/17   Faythe Ghee, PA-C    Allergies Patient has no known allergies.  History reviewed. No pertinent family history.  Social History Social History   Tobacco Use  . Smoking status: Current Every Day Smoker    Packs/day: 0.50    Types: Cigarettes  . Smokeless tobacco: Never Used  Substance Use Topics  . Alcohol use: No  . Drug use: No    Review of Systems Constitutional: No fever/chills Eyes: No visual changes. ENT: No sore throat. Cardiovascular: Denies chest pain. Respiratory: Positive for cough and shortness of breath. Gastrointestinal: No abdominal pain.  No nausea, no vomiting.  No  diarrhea.   Genitourinary: Negative for dysuria. Musculoskeletal: Negative for back pain. Skin: Negative for rash. Neurological: Negative for headaches, focal weakness or numbness.  ____________________________________________   PHYSICAL EXAM:  VITAL SIGNS: ED Triage Vitals [08/30/17 1739]  Enc Vitals Group     BP (!) 144/91     Pulse Rate (!) 122     Resp 20     Temp 98.6 F (37 C)     Temp Source Oral     SpO2 94 %     Weight 160 lb (72.6 kg)     Height 5\' 2"  (1.575 m)      Head Circumference      Peak Flow      Pain Score 10   Constitutional: Alert and oriented. Well appearing and in no distress. Eyes: Conjunctivae are normal.  ENT   Head: Normocephalic and atraumatic.   Nose: No congestion/rhinnorhea.   Mouth/Throat: Mucous membranes are moist.   Neck: No stridor. Hematological/Lymphatic/Immunilogical: No cervical lymphadenopathy. Cardiovascular: Normal rate, regular rhythm.  No murmurs, rubs, or gallops. Respiratory: Normal respiratory effort without tachypnea nor retractions. Diffuse expiratory wheezing.  Gastrointestinal: Soft and non tender. No rebound. No guarding.  Genitourinary: Deferred Musculoskeletal: Normal range of motion in all extremities. No lower extremity edema. Neurologic:  Normal speech and language. No gross focal neurologic deficits are appreciated.  Skin:  Skin is warm, dry and intact. No rash noted. Psychiatric: Mood and affect are normal. Speech and behavior are normal. Patient exhibits appropriate insight and judgment.  ____________________________________________    LABS (pertinent positives/negatives)  Trop <0.03 CBC wbc 11.7, hgb 14.7, plt 307 BMP na 136, k 3.6, glu 129, cr 0.81  ____________________________________________   EKG  I, Phineas SemenGraydon Jenisse Vullo, attending physician, personally viewed and interpreted this EKG  EKG Time: 1745 Rate: 113 Rhythm: sinus tachycardia Axis: normal Intervals: qtc 430 QRS: narrow ST changes: no st elevation Impression: possible left atrial enlargement ____________________________________________    RADIOLOGY  CXR No acute consolidation or edema  ____________________________________________   PROCEDURES  Procedures  ____________________________________________   INITIAL IMPRESSION / ASSESSMENT AND PLAN / ED COURSE  Pertinent labs & imaging results that were available during my care of the patient were reviewed by me and considered in my medical  decision making (see chart for details).  Patient presents to the emergency department because of concern for cough and shortness of breath. She did have concern for congestion so mucomyst was tried. She did feel this helped. Patient's x-ray without pneumonia or ptx. Will plan on giving patient another course of steroids for COPD and antitussive medication. Discussed findings and plan with patient.    ____________________________________________   FINAL CLINICAL IMPRESSION(S) / ED DIAGNOSES  Final diagnoses:  COPD exacerbation (HCC)  Cough     Note: This dictation was prepared with Dragon dictation. Any transcriptional errors that result from this process are unintentional     Phineas SemenGoodman, Nyko Gell, MD 08/31/17 [phone]

## 2017-08-30 NOTE — Discharge Instructions (Signed)
Please seek medical attention for any high fevers, chest pain, shortness of breath, change in behavior, persistent vomiting, bloody stool or any other new or concerning symptoms.  

## 2017-08-30 NOTE — ED Triage Notes (Signed)
Informed pt to come back and let RN recheck vitals if gets worse.

## 2017-08-30 NOTE — ED Triage Notes (Addendum)
[facility] N[facility]SE [facility]  Has been using nebs at home without relief. Recent abx for bronchitis. Labored at check in.  Sat 92% RA at check in.  Family reports they have pulse ox at home and drops to 80s with exertion.  HR 122. Pulled next for triage.

## 2017-08-30 NOTE — ED Notes (Signed)
ED Provider at bedside. 

## 2017-08-30 NOTE — ED Triage Notes (Signed)
Pt states she is feeling SOB and has COPD. States this has been going on for a while. Was on antibiotic and steroid but states not any better. Has been doing breathing treatments at home. Family states her 3902 has dropped into 80's at home while moving. Pt has a cough. Pt thinks he is retaining fluid. Talking in complete sentences, mild resp distress noted. Last breathing treatment around 2pm.

## 2017-09-30 ENCOUNTER — Observation Stay
Admission: EM | Admit: 2017-09-30 | Discharge: 2017-10-02 | Disposition: A | Discharge status: Home / Self Care | Payer: Medicare Other | Attending: Internal Medicine | Admitting: Internal Medicine

## 2017-09-30 ENCOUNTER — Emergency Department: Payer: Medicare Other

## 2017-09-30 ENCOUNTER — Other Ambulatory Visit: Payer: Self-pay

## 2017-09-30 ENCOUNTER — Encounter: Payer: Self-pay | Admitting: Emergency Medicine

## 2017-09-30 DIAGNOSIS — F17210 Nicotine dependence, cigarettes, uncomplicated: Secondary | ICD-10-CM | POA: Insufficient documentation

## 2017-09-30 DIAGNOSIS — J441 Chronic obstructive pulmonary disease with (acute) exacerbation: Secondary | ICD-10-CM | POA: Insufficient documentation

## 2017-09-30 DIAGNOSIS — M797 Fibromyalgia: Secondary | ICD-10-CM | POA: Insufficient documentation

## 2017-09-30 DIAGNOSIS — R0603 Acute respiratory distress: Secondary | ICD-10-CM

## 2017-09-30 DIAGNOSIS — J209 Acute bronchitis, unspecified: Secondary | ICD-10-CM | POA: Diagnosis not present

## 2017-09-30 DIAGNOSIS — I10 Essential (primary) hypertension: Secondary | ICD-10-CM | POA: Diagnosis not present

## 2017-09-30 DIAGNOSIS — J440 Chronic obstructive pulmonary disease with acute lower respiratory infection: Principal | ICD-10-CM | POA: Insufficient documentation

## 2017-09-30 DIAGNOSIS — E785 Hyperlipidemia, unspecified: Secondary | ICD-10-CM | POA: Diagnosis not present

## 2017-09-30 DIAGNOSIS — J449 Chronic obstructive pulmonary disease, unspecified: Secondary | ICD-10-CM | POA: Diagnosis present

## 2017-09-30 DIAGNOSIS — Z79899 Other long term (current) drug therapy: Secondary | ICD-10-CM | POA: Insufficient documentation

## 2017-09-30 HISTORY — DX: Acute bronchitis, unspecified: J20.9

## 2017-09-30 HISTORY — DX: Acute respiratory distress: R06.03

## 2017-09-30 LAB — CBC WITH DIFFERENTIAL/PLATELET
BASOS ABS: 0.1 10*3/uL (ref 0–0.1)
Basophils Relative: 1 %
EOS ABS: 1.8 10*3/uL — AB (ref 0–0.7)
EOS PCT: 13 %
HCT: 43.1 % (ref 35.0–47.0)
Hemoglobin: 15.2 g/dL (ref 12.0–16.0)
Lymphocytes Relative: 18 %
Lymphs Abs: 2.4 10*3/uL (ref 1.0–3.6)
MCH: 31.3 pg (ref 26.0–34.0)
MCHC: 35.3 g/dL (ref 32.0–36.0)
MCV: 88.5 fL (ref 80.0–100.0)
MONO ABS: 1 10*3/uL — AB (ref 0.2–0.9)
Monocytes Relative: 7 %
Neutro Abs: 8.6 10*3/uL — ABNORMAL HIGH (ref 1.4–6.5)
Neutrophils Relative %: 61 %
PLATELETS: 462 10*3/uL — AB (ref 150–440)
RBC: 4.87 MIL/uL (ref 3.80–5.20)
RDW: 14.9 % — AB (ref 11.5–14.5)
WBC: 14 10*3/uL — AB (ref 3.6–11.0)

## 2017-09-30 LAB — BLOOD GAS, VENOUS
ACID-BASE DEFICIT: 0.8 mmol/L (ref 0.0–2.0)
BICARBONATE: 23.5 mmol/L (ref 20.0–28.0)
FIO2: 0.21
O2 SAT: 96.1 %
PATIENT TEMPERATURE: 37
pCO2, Ven: 37 mmHg — ABNORMAL LOW (ref 44.0–60.0)
pH, Ven: 7.41 (ref 7.250–7.430)
pO2, Ven: 82 mmHg — ABNORMAL HIGH (ref 32.0–45.0)

## 2017-09-30 LAB — BASIC METABOLIC PANEL
Anion gap: 11 (ref 5–15)
BUN: 11 mg/dL (ref 6–20)
CALCIUM: 9.4 mg/dL (ref 8.9–10.3)
CO2: 23 mmol/L (ref 22–32)
Chloride: 101 mmol/L (ref 101–111)
Creatinine, Ser: 0.89 mg/dL (ref 0.44–1.00)
GFR calc Af Amer: >60 mL/min (ref >60)
Glucose, Bld: 134 mg/dL — ABNORMAL HIGH (ref 65–99)
POTASSIUM: 4.1 mmol/L (ref 3.5–5.1)
SODIUM: 135 mmol/L (ref 135–145)

## 2017-09-30 LAB — BRAIN NATRIURETIC PEPTIDE: B NATRIURETIC PEPTIDE 5: 30 pg/mL (ref 0.0–100.0)

## 2017-09-30 LAB — TROPONIN I: Troponin I: <0.03 ng/mL (ref <0.03)

## 2017-09-30 LAB — GLUCOSE, CAPILLARY: Glucose-Capillary: 128 mg/dL — ABNORMAL HIGH (ref 65–99)

## 2017-09-30 MED ORDER — IPRATROPIUM-ALBUTEROL 0.5-2.5 (3) MG/3ML IN SOLN
3.0000 mL | Freq: Four times a day (QID) | RESPIRATORY_TRACT | Status: DC
Start: 2017-09-30 — End: 2017-10-02
  Administered 2017-09-30 – 2017-10-02 (×7): 3 mL via RESPIRATORY_TRACT
  Filled 2017-09-30 (×6): qty 3

## 2017-09-30 MED ORDER — CYCLOBENZAPRINE HCL 10 MG PO TABS
10.0000 mg | ORAL_TABLET | Freq: Three times a day (TID) | ORAL | Status: DC
  Administered 2017-09-30 – 2017-10-02 (×5): 10 mg via ORAL
  Filled 2017-09-30 (×5): qty 1

## 2017-09-30 MED ORDER — SODIUM CHLORIDE 0.9 % IV SOLN
INTRAVENOUS | Status: DC
  Administered 2017-09-30 – 2017-10-01 (×2): via INTRAVENOUS

## 2017-09-30 MED ORDER — ACETAMINOPHEN 650 MG RE SUPP: 650.0000 mg | Freq: Four times a day (QID) | RECTAL | Status: DC | PRN

## 2017-09-30 MED ORDER — BISACODYL 10 MG RE SUPP
10.0000 mg | Freq: Every day | RECTAL | Status: DC | PRN
  Filled 2017-09-30: qty 1

## 2017-09-30 MED ORDER — IPRATROPIUM-ALBUTEROL 0.5-2.5 (3) MG/3ML IN SOLN
3.0000 mL | Freq: Once | RESPIRATORY_TRACT | Status: AC
  Administered 2017-09-30: 3 mL via RESPIRATORY_TRACT
  Filled 2017-09-30: qty 3

## 2017-09-30 MED ORDER — ONDANSETRON HCL 4 MG/2ML IJ SOLN: 4.0000 mg | Freq: Four times a day (QID) | INTRAMUSCULAR | Status: DC | PRN

## 2017-09-30 MED ORDER — METHYLPREDNISOLONE SODIUM SUCC 125 MG IJ SOLR
60.0000 mg | Freq: Four times a day (QID) | INTRAMUSCULAR | Status: DC
  Administered 2017-09-30 – 2017-10-01 (×2): 60 mg via INTRAVENOUS
  Filled 2017-09-30 (×2): qty 2

## 2017-09-30 MED ORDER — LORAZEPAM 2 MG/ML IJ SOLN: 0.5000 mg | INTRAMUSCULAR | Status: DC | PRN

## 2017-09-30 MED ORDER — MAGNESIUM SULFATE 2 GM/50ML IV SOLN
2.0000 g | Freq: Once | INTRAVENOUS | Status: AC
  Administered 2017-09-30: 21:00:00 2 g via INTRAVENOUS
  Filled 2017-09-30: qty 50

## 2017-09-30 MED ORDER — HYDROCOD POLST-CPM POLST ER 10-8 MG/5ML PO SUER
5.0000 mL | Freq: Two times a day (BID) | ORAL | Status: DC | PRN
  Administered 2017-09-30: 5 mL via ORAL
  Filled 2017-09-30: qty 5

## 2017-09-30 MED ORDER — ONDANSETRON HCL 4 MG PO TABS: 4.0000 mg | ORAL_TABLET | Freq: Four times a day (QID) | ORAL | Status: DC | PRN

## 2017-09-30 MED ORDER — ATENOLOL 25 MG PO TABS
25.0000 mg | ORAL_TABLET | Freq: Every day | ORAL | Status: DC
  Administered 2017-09-30 – 2017-10-02 (×3): 25 mg via ORAL
  Filled 2017-09-30 (×3): qty 1

## 2017-09-30 MED ORDER — HYDRALAZINE HCL 20 MG/ML IJ SOLN: 10.0000 mg | INTRAMUSCULAR | Status: DC | PRN

## 2017-09-30 MED ORDER — LEVOFLOXACIN IN D5W 750 MG/150ML IV SOLN
750.0000 mg | Freq: Once | INTRAVENOUS | Status: AC
  Administered 2017-09-30: 750 mg via INTRAVENOUS
  Filled 2017-09-30: qty 150

## 2017-09-30 MED ORDER — HEPARIN SODIUM (PORCINE) 5000 UNIT/ML IJ SOLN
5000.0000 [IU] | Freq: Three times a day (TID) | INTRAMUSCULAR | Status: DC
  Administered 2017-09-30 – 2017-10-01 (×2): 5000 [IU] via SUBCUTANEOUS
  Filled 2017-09-30 (×2): qty 1

## 2017-09-30 MED ORDER — GABAPENTIN 300 MG PO CAPS
400.0000 mg | ORAL_CAPSULE | Freq: Three times a day (TID) | ORAL | Status: DC
  Administered 2017-09-30 – 2017-10-02 (×5): 400 mg via ORAL
  Filled 2017-09-30 (×5): qty 1

## 2017-09-30 MED ORDER — DOCUSATE SODIUM 100 MG PO CAPS
100.0000 mg | ORAL_CAPSULE | Freq: Two times a day (BID) | ORAL | Status: DC
  Administered 2017-09-30 – 2017-10-01 (×2): 100 mg via ORAL
  Filled 2017-09-30 (×4): qty 1

## 2017-09-30 MED ORDER — LEVOFLOXACIN IN D5W 750 MG/150ML IV SOLN
750.0000 mg | INTRAVENOUS | Status: DC
  Filled 2017-09-30: qty 150

## 2017-09-30 MED ORDER — FAMOTIDINE IN NACL 20-0.9 MG/50ML-% IV SOLN
20.0000 mg | Freq: Two times a day (BID) | INTRAVENOUS | Status: DC
  Administered 2017-09-30 – 2017-10-01 (×2): 20 mg via INTRAVENOUS
  Filled 2017-09-30 (×2): qty 50

## 2017-09-30 MED ORDER — ACETAMINOPHEN 325 MG PO TABS: 650.0000 mg | ORAL_TABLET | Freq: Four times a day (QID) | ORAL | Status: DC | PRN

## 2017-09-30 MED ORDER — INSULIN ASPART 100 UNIT/ML ~~LOC~~ SOLN
0.0000 [IU] | Freq: Three times a day (TID) | SUBCUTANEOUS | Status: DC
  Administered 2017-10-01 (×2): 1 [IU] via SUBCUTANEOUS
  Filled 2017-09-30 (×2): qty 1

## 2017-09-30 MED ORDER — IPRATROPIUM-ALBUTEROL 0.5-2.5 (3) MG/3ML IN SOLN
RESPIRATORY_TRACT | Status: AC
  Administered 2017-09-30: 3 mL via RESPIRATORY_TRACT
  Filled 2017-09-30: qty 6

## 2017-09-30 MED ORDER — MOMETASONE FURO-FORMOTEROL FUM 200-5 MCG/ACT IN AERO
2.0000 | INHALATION_SPRAY | Freq: Two times a day (BID) | RESPIRATORY_TRACT | Status: DC
  Administered 2017-09-30 – 2017-10-02 (×4): 2 via RESPIRATORY_TRACT
  Filled 2017-09-30: qty 8.8

## 2017-09-30 MED ORDER — GUAIFENESIN ER 600 MG PO TB12
1200.0000 mg | ORAL_TABLET | Freq: Two times a day (BID) | ORAL | Status: DC
  Administered 2017-09-30 – 2017-10-02 (×4): 1200 mg via ORAL
  Filled 2017-09-30 (×4): qty 2

## 2017-09-30 NOTE — ED Triage Notes (Signed)
Pt to ED via [facility] from home for shortness of breath since last night. Pt has productive cough. Pt states that she has been running fever and having cold sweats. Pt is in NAD at this time.

## 2017-09-30 NOTE — ED Provider Notes (Signed)
[facility] Emergency Department Provider Note       Time seen: ----------------------------------------- 4:16 PM on 09/30/2017 -----------------------------------------   I have reviewed the triage vital signs and the nursing notes.  HISTORY   Chief Complaint No chief complaint on file.    HPI Karen Russell is a 42 y.o. female with a history of Reiter's, COPD, fibromyalgia, hyperlipidemia and hypertension who presents to the ED for difficulty breathing.  Patient reports having difficulty breathing that has been persistent over the last 2 months.  Patient states she was seen here 3 weeks ago and was given steroids as well as breathing treatments but her symptoms keep recurring.  All she has right now due to insurance difficulty is nebulizer treatments at home.  She denies fevers or chills, has significant dyspnea with exertion.  Past Medical History:  Diagnosis Date  . Arthritis   . COPD (chronic obstructive pulmonary disease) (HCC)   . Fibromyalgia syndrome   . Hyperlipemia   . Hypertension     Patient Active Problem List   Diagnosis Date Noted  . COPD exacerbation (HCC) 05/25/2017  . Respiratory failure with hypoxia (HCC) 08/10/2016    Past Surgical History:  Procedure Laterality Date  . KIDNEY SURGERY    . TUBAL LIGATION      Allergies Patient has no known allergies.  Social History Social History   Tobacco Use  . Smoking status: Current Every Day Smoker    Packs/day: 0.50    Types: Cigarettes  . Smokeless tobacco: Never Used  Substance Use Topics  . Alcohol use: No  . Drug use: No   Review of Systems Constitutional: Negative for fever. Cardiovascular: Negative for chest pain. Respiratory: Positive for shortness of breath and cough Gastrointestinal: Negative for abdominal pain, vomiting and diarrhea. Musculoskeletal: Negative for back pain. Skin: Negative for rash. Neurological: Negative for headaches, focal weakness or  numbness.  All systems negative/normal/unremarkable except as stated in the HPI  ____________________________________________   PHYSICAL EXAM:  VITAL SIGNS: ED Triage Vitals  Enc Vitals Group     BP      Pulse      Resp      Temp      Temp src      SpO2      Weight      Height      Head Circumference      Peak Flow      Pain Score      Pain Loc      Pain Edu?      Excl. in GC?    Constitutional: Alert and oriented.  Mild distress Eyes: Conjunctivae are normal. Normal extraocular movements. ENT   Head: Normocephalic and atraumatic.   Nose: No congestion/rhinnorhea.   Mouth/Throat: Mucous membranes are moist.   Neck: No stridor. Cardiovascular: Normal rate, regular rhythm. No murmurs, rubs, or gallops. Respiratory: Tachypnea with wheezing and rhonchi bilaterally Gastrointestinal: Soft and nontender. Normal bowel sounds Musculoskeletal: Nontender with normal range of motion in extremities. No lower extremity tenderness nor edema. Neurologic:  Normal speech and language. No gross focal neurologic deficits are appreciated.  Skin:  Skin is warm, dry and intact. No rash noted. Psychiatric: Depressed mood ___________________________________________  ED COURSE:  As part of my medical decision making, I reviewed the following data within the electronic MEDICAL RECORD NUMBER History obtained from family if available, nursing notes, old chart and ekg, as well as notes from prior ED visits. Patient presented for difficulty breathing and  likely COPD exacerbation, we will assess with labs and imaging as indicated at this time.   Procedures ____________________________________________   LABS (pertinent positives/negatives)  Labs Reviewed  CBC WITH DIFFERENTIAL/PLATELET - Abnormal; Notable for the following components:      Result Value   WBC 14.0 (*)    RDW 14.9 (*)    Platelets 462 (*)    Neutro Abs 8.6 (*)    Monocytes Absolute 1.0 (*)    Eosinophils Absolute 1.8  (*)    All other components within normal limits  BASIC METABOLIC PANEL - Abnormal; Notable for the following components:   Glucose, Bld 134 (*)    All other components within normal limits  BLOOD GAS, VENOUS - Abnormal; Notable for the following components:   pCO2, Ven 37 (*)    pO2, Ven 82.0 (*)    All other components within normal limits  BRAIN NATRIURETIC PEPTIDE  TROPONIN I    RADIOLOGY  Chest x-ray is unremarkable ____________________________________________  DIFFERENTIAL DIAGNOSIS   COPD, pneumonia, pneumothorax, URI, bronchitis, allergies  FINAL ASSESSMENT AND PLAN  COPD exacerbation   Plan: The patient had presented for difficulty breathing and likely COPD exacerbation.  Patient did receive Solu-Medrol in route, was given 2 additional duo nebs here.  Patient's labs did not reveal any acute process other than leukocytosis. Patient's imaging was negative for pneumonia.  Patient still has persistent wheezing and shortness of breath despite duo nebs and steroids.  She may benefit from antibiotics as well.  I will discuss with hospitalist for admission.   Ulice Dash, MD   Note: This note was generated in part or whole with voice recognition software. Voice recognition is usually quite accurate but there are transcription errors that can and very often do occur. I apologize for any typographical errors that were not detected and corrected.     Emily Filbert, MD 09/30/17 [phone]

## 2017-09-30 NOTE — Progress Notes (Signed)
Pharmacy Antibiotic Note  Karen Russell is a 42 y.o. female admitted on 09/30/2017 with CAP.  Pharmacy has been consulted for levofloxacin dosing.  Plan: Levofloxacin 750 mg IV Q24H  Weight: 160 lb (72.6 kg)  Temp (24hrs), Avg:98.4 F (36.9 C), Min:98.4 F (36.9 C), Max:98.4 F (36.9 C)  Recent Labs  Lab 09/30/17 1622  WBC 14.0*  CREATININE 0.89    Estimated Creatinine Clearance: 76 mL/min (by C-G formula based on SCr of 0.89 mg/dL).    No Known Allergies  Antimicrobials this admission:   Dose adjustments this admission:   Microbiology results:  BCx:   UCx:    Sputum:    MRSA PCR:   Thank you for allowing pharmacy to be a part of this patient's care.  Carola Frost, Pharm.D., BCPS Clinical Pharmacist 09/30/2017 5:35 PM

## 2017-09-30 NOTE — H&P (Signed)
History and Physical    Karen Russell DOB: [birth year] DOA: 09/30/2017  Referring physician: Dr. Mayford Knife PCP: Evelene Croon, MD  Specialists: none  Chief Complaint: SOB and cough  HPI: Karen Russell is a 42 y.o. female has a past medical history significant for COPD and HTn now with 1 week hx of worsening SOB with cough and fever. In ER, O2 sats low with exertion. No improvement despite SVN's. She is now admitted. CXR negative for pneumonia. Some CP with cough. No N/V/D. Has had fever at home  Review of Systems: The patient denies anorexia,  weight loss,, vision loss, decreased hearing, hoarseness,, syncope, peripheral edema, balance deficits, hemoptysis, abdominal pain, melena, hematochezia, severe indigestion/heartburn, hematuria, incontinence, genital sores, muscle weakness, suspicious skin lesions, transient blindness, difficulty walking, depression, unusual weight change, abnormal bleeding, enlarged lymph nodes, angioedema, and breast masses.   Past Medical History:  Diagnosis Date  . Arthritis   . COPD (chronic obstructive pulmonary disease) (HCC)   . Fibromyalgia syndrome   . Hyperlipemia   . Hypertension    Past Surgical History:  Procedure Laterality Date  . KIDNEY SURGERY    . TUBAL LIGATION     Social History:  reports that she has been smoking cigarettes.  She has been smoking about 0.50 packs per day. She has never used smokeless tobacco. She reports that she does not drink alcohol or use drugs.  No Known Allergies  History reviewed. No pertinent family history.  Prior to Admission medications   Medication Sig Start Date End Date Taking? Authorizing Provider  atenolol (TENORMIN) 25 MG tablet Take 1 tablet (25 mg total) by mouth daily. 05/28/17  Yes Enid Baas, MD  cyclobenzaprine (FLEXERIL) 10 MG tablet Take 1 tablet (10 mg total) by mouth 3 (three) times daily. 05/28/17  Yes Enid Baas, MD  gabapentin (NEURONTIN) 400 MG  capsule Take 400 mg by mouth 3 (three) times daily. 03/29/17  Yes [provider]  meloxicam (MOBIC) 7.5 MG tablet Take 7.5 mg by mouth 2 (two) times daily. 04/20/17  Yes [provider]  albuterol (PROVENTIL) (2.5 MG/3ML) 0.083% nebulizer solution Take 3 mLs (2.5 mg total) by nebulization every 6 (six) hours as needed for wheezing or shortness of breath. Patient not taking: Reported on 08/30/2017 03/16/17   Sharyn Creamer, MD  azithromycin (ZITHROMAX Z-PAK) 250 MG tablet 2 pills today then 1 pill a day for 4 days Patient not taking: Reported on 08/30/2017 08/07/17   Faythe Ghee, PA-C  benzonatate (TESSALON PERLES) 100 MG capsule Take 1 capsule (100 mg total) by mouth every 6 (six) hours as needed for cough. 08/30/17 08/30/18  Phineas Semen, MD  chlorpheniramine-HYDROcodone [employer] PENNKINETIC ER) 10-8 MG/5ML SUER Take 5 mLs by mouth every 12 (twelve) hours as needed for cough. Patient not taking: Reported on 08/30/2017 08/07/17   Faythe Ghee, PA-C  HYDROcodone-acetaminophen (NORCO/VICODIN) 5-325 MG tablet Take 1 tablet by mouth every 6 (six) hours as needed for moderate pain or severe pain. Patient not taking: Reported on 08/30/2017 05/28/17   Enid Baas, MD  ipratropium-albuterol (DUONEB) 0.5-2.5 (3) MG/3ML SOLN Take 3 mLs by nebulization every 6 (six) hours as needed. 08/07/17   Fisher, Roselyn Bering, PA-C  predniSONE (DELTASONE) 20 MG tablet Take 2 tablets ( ) daily for days 1-7 Take 1 tablet ( ) daily for days 8-14 Patient not taking: Reported on 09/30/2017 08/30/17   Phineas Semen, MD  predniSONE (STERAPRED UNI-PAK 21 TAB) 10 MG (21) TBPK tablet Take 6 pills  on day one then decrease by 1 pill each day Patient not taking: Reported on 08/30/2017 08/07/17   Faythe Ghee, PA-C   Physical Exam: Vitals:   09/30/17 1619 09/30/17 1620  BP: (!) 152/86   Pulse: (!) 120   Resp: 18   Temp: 98.4 F (36.9 C)   SpO2: 94%   Weight:  72.6 kg (160 lb)     General:  No  apparent distress, WDWN, [place]/AT  Eyes: PERRL, EOMI, no scleral icterus, conjunctiva clear  ENT: moist oropharynx without exudate, TM's benign, dentition fair  Neck: supple, no lymphadenopathy  Cardiovascular: rapid rate with regular rhythm without MRG; 2+ peripheral pulses, no JVD, no peripheral edema  Respiratory: diffuse rhonchi without wheezes or rales. No dullness. Respiratory effort increased  Abdomen: soft, non tender to palpation, positive bowel sounds, no guarding, no rebound  Skin: no rashes or lesions  Musculoskeletal: normal bulk and tone, no joint swelling  Psychiatric: normal mood and affect, A&OX3  Neurologic: CN 2-12 grossly intact, Motor strength 5/5 in all 4 groups with symmetric DTR's and non-focal sensoryeexam  Labs on Admission:  Basic Metabolic Panel: Recent Labs  Lab 09/30/17 1622  NA 135  K 4.1  CL 101  CO2 23  GLUCOSE 134*  BUN 11  CREATININE 0.89  CALCIUM 9.4   Liver Function Tests: No results for input(s): AST, ALT, ALKPHOS, BILITOT, PROT, ALBUMIN in the last 168 hours. No results for input(s): LIPASE, AMYLASE in the last 168 hours. No results for input(s): AMMONIA in the last 168 hours. CBC: Recent Labs  Lab 09/30/17 1622  WBC 14.0*  NEUTROABS 8.6*  HGB 15.2  HCT 43.1  MCV 88.5  PLT 462*   Cardiac Enzymes: Recent Labs  Lab 09/30/17 1622  TROPONINI <0.03    BNP (last 3 results) Recent Labs    09/30/17 1622  BNP 30.0    ProBNP (last 3 results) No results for input(s): PROBNP in the last 8760 hours.  CBG: No results for input(s): GLUCAP in the last 168 hours.  Radiological Exams on Admission: Dg Chest 2 View  Result Date: 09/30/2017 CLINICAL DATA:  Patient with shortness of breath and productive cough. EXAM: CHEST - 2 VIEW COMPARISON:  Chest radiograph 08/30/2017. FINDINGS: Monitoring leads overlie the patient. Normal cardiac and mediastinal contours. No consolidative pulmonary opacities. No pleural effusion or  pneumothorax. Regional skeleton is unremarkable. IMPRESSION: No acute cardiopulmonary process. Electronically Signed   By: Annia Belt M.D.   On: 09/30/2017 17:16    EKG: Independently reviewed.  Assessment/Plan Principal Problem:   COPD exacerbation (HCC) Active Problems:   Acute bronchitis   HTN (hypertension)   Respiratory distress, acute   Will observe on floor with IV fluids, IV ABX, IV steroids, and SVN's. O2 if needed. Follow BP and sugars. Repeat labs in AM  Diet: low salt Fluids: NS@75  DVT Prophylaxis: SQ Heparin  Code Status: FULL  Family Communication: none  Disposition Plan: home  Time spent:  50 min

## 2017-09-30 NOTE — Progress Notes (Signed)
Pt has IS at home & knows how to use it. She will initiate it later when she is less sob.

## 2017-10-01 LAB — CBC
HCT: 44.1 % (ref 35.0–47.0)
Hemoglobin: 15.2 g/dL (ref 12.0–16.0)
MCH: 31 pg (ref 26.0–34.0)
MCHC: 34.5 g/dL (ref 32.0–36.0)
MCV: 89.8 fL (ref 80.0–100.0)
PLATELETS: 500 10*3/uL — AB (ref 150–440)
RBC: 4.91 MIL/uL (ref 3.80–5.20)
RDW: 14.6 % — AB (ref 11.5–14.5)
WBC: 12.3 10*3/uL — AB (ref 3.6–11.0)

## 2017-10-01 LAB — COMPREHENSIVE METABOLIC PANEL
ALK PHOS: 95 U/L (ref 38–126)
ALT: 26 U/L (ref 14–54)
AST: 35 U/L (ref 15–41)
Albumin: 4 g/dL (ref 3.5–5.0)
Anion gap: 8 (ref 5–15)
BILIRUBIN TOTAL: 0.3 mg/dL (ref 0.3–1.2)
BUN: 10 mg/dL (ref 6–20)
CALCIUM: 9.1 mg/dL (ref 8.9–10.3)
CO2: 23 mmol/L (ref 22–32)
CREATININE: 0.94 mg/dL (ref 0.44–1.00)
Chloride: 106 mmol/L (ref 101–111)
GFR calc Af Amer: >60 mL/min (ref >60)
Glucose, Bld: 139 mg/dL — ABNORMAL HIGH (ref 65–99)
Potassium: 4.5 mmol/L (ref 3.5–5.1)
Sodium: 137 mmol/L (ref 135–145)
TOTAL PROTEIN: 8.2 g/dL — AB (ref 6.5–8.1)

## 2017-10-01 LAB — GLUCOSE, CAPILLARY
GLUCOSE-CAPILLARY: 109 mg/dL — AB (ref 65–99)
Glucose-Capillary: 142 mg/dL — ABNORMAL HIGH (ref 65–99)
Glucose-Capillary: 127 mg/dL — ABNORMAL HIGH (ref 65–99)
Glucose-Capillary: 108 mg/dL — ABNORMAL HIGH (ref 65–99)

## 2017-10-01 MED ORDER — ENOXAPARIN SODIUM 40 MG/0.4ML ~~LOC~~ SOLN
40.0000 mg | SUBCUTANEOUS | Status: DC
  Administered 2017-10-01: 22:00:00 40 mg via SUBCUTANEOUS
  Filled 2017-10-01: qty 0.4

## 2017-10-01 MED ORDER — ZOLPIDEM TARTRATE 5 MG PO TABS: 5.0000 mg | ORAL_TABLET | Freq: Every evening | ORAL | Status: DC | PRN

## 2017-10-01 MED ORDER — HYDROCOD POLST-CPM POLST ER 10-8 MG/5ML PO SUER
5.0000 mL | Freq: Two times a day (BID) | ORAL | Status: DC | PRN
  Administered 2017-10-01 – 2017-10-02 (×3): 5 mL via ORAL
  Filled 2017-10-01 (×3): qty 5

## 2017-10-01 MED ORDER — TRAMADOL HCL 50 MG PO TABS: 50.0000 mg | ORAL_TABLET | Freq: Four times a day (QID) | ORAL | Status: DC | PRN

## 2017-10-01 MED ORDER — LEVOFLOXACIN 750 MG PO TABS
750.0000 mg | ORAL_TABLET | Freq: Every day | ORAL | Status: DC
  Administered 2017-10-01 – 2017-10-02 (×2): 750 mg via ORAL
  Filled 2017-10-01 (×2): qty 1

## 2017-10-01 MED ORDER — FAMOTIDINE 20 MG PO TABS
20.0000 mg | ORAL_TABLET | Freq: Two times a day (BID) | ORAL | Status: DC
  Administered 2017-10-01 – 2017-10-02 (×2): 20 mg via ORAL
  Filled 2017-10-01 (×2): qty 1

## 2017-10-01 MED ORDER — METHYLPREDNISOLONE SODIUM SUCC 40 MG IJ SOLR
40.0000 mg | Freq: Two times a day (BID) | INTRAMUSCULAR | Status: DC
  Administered 2017-10-01 – 2017-10-02 (×2): 40 mg via INTRAVENOUS
  Filled 2017-10-01 (×2): qty 1

## 2017-10-01 NOTE — Progress Notes (Signed)
[facility] [place] [facility]   PATIENT NAME: Karen Russell    MR#:  [id number]  DATE OF BIRTH:  06-19-1975  SUBJECTIVE:   Patient with improved shortness of breath and wheezing  REVIEW OF SYSTEMS:    Review of Systems  Constitutional: Negative for fever, chills weight loss HENT: Negative for ear pain, nosebleeds, congestion, facial swelling, rhinorrhea, neck pain, neck stiffness and ear discharge.   Respiratory: Improved cough, shortness of breath, wheezing  Cardiovascular: Negative for chest pain, palpitations and leg swelling.  Gastrointestinal: Negative for heartburn, abdominal pain, vomiting, diarrhea or consitpation Genitourinary: Negative for dysuria, urgency, frequency, hematuria Musculoskeletal: Negative for back pain or joint pain Neurological: Negative for dizziness, seizures, syncope, focal weakness,  numbness and headaches.  Hematological: Does not bruise/bleed easily.  Psychiatric/Behavioral: Negative for hallucinations, confusion, dysphoric mood    Tolerating Diet: yes      DRUG ALLERGIES:  No Known Allergies  VITALS:  Blood pressure 119/72, pulse 93, temperature 98.1 F (36.7 C), temperature source Oral, resp. rate (!) 21, height  (1.575 m), weight 78.8 kg (173 lb 11.2 oz), SpO2 92 %.  PHYSICAL EXAMINATION:  Constitutional: Appears well-developed and well-nourished. No distress. HENT: Normocephalic. Marland Kitchen Oropharynx is clear and moist.  Eyes: Conjunctivae and EOM are normal. PERRLA, no scleral icterus.  Neck: Normal ROM. Neck supple. No JVD. No tracheal deviation. CVS: RRR, S1/S2 +, no murmurs, no gallops, no carotid bruit.  Pulmonary: Normal respiratory effort with prolonged expiratory wheezing bilaterally Abdominal: Soft. BS +,  no distension, tenderness, rebound or guarding.  Musculoskeletal: Normal range of motion. No edema and no tenderness.  Neuro: Alert. CN 2-12 grossly intact. No focal deficits. Skin: Skin is warm and  dry. No rash noted. Psychiatric: Normal mood and affect.      LABORATORY PANEL:   CBC Recent Labs  Lab 10/01/17 0306  WBC 12.3*  HGB 15.2  HCT 44.1  PLT 500*   ------------------------------------------------------------------------------------------------------------------  Chemistries  Recent Labs  Lab 10/01/17 0306  NA 137  K 4.5  CL 106  CO2 23  GLUCOSE 139*  BUN 10  CREATININE 0.94  CALCIUM 9.1  AST 35  ALT 26  ALKPHOS 95  BILITOT 0.3   ------------------------------------------------------------------------------------------------------------------  Cardiac Enzymes Recent Labs  Lab 09/30/17 1622  TROPONINI <0.03   ------------------------------------------------------------------------------------------------------------------  RADIOLOGY:  Dg Chest 2 View  Result Date: 09/30/2017 CLINICAL DATA:  Patient with shortness of breath and productive cough. EXAM: CHEST - 2 VIEW COMPARISON:  Chest radiograph 08/30/2017. FINDINGS: Monitoring leads overlie the patient. Normal cardiac and mediastinal contours. No consolidative pulmonary opacities. No pleural effusion or pneumothorax. Regional skeleton is unremarkable. IMPRESSION: No acute cardiopulmonary process. Electronically Signed   By: Annia Belt M.D.   On: 09/30/2017 17:16     ASSESSMENT AND PLAN:   42 year old female with history of COPD and tobacco dependence who presents with shortness of breath.  1.  Acute exacerbation of COPD due to acute bronchitis  Wean IV steroids Continue nebs and inhalers Continue Levaquin for acute bronchitis  2.Tobacco dependence: Patient is encouraged to quit smoking. Counseling was provided for 4 minutes.  3.  Essential hypertension: Continue atenolol    Management plans discussed with the patient and she is in agreement.  CODE STATUS: FULL  TOTAL TIME TAKING CARE OF THIS PATIENT: 30 minutes.     POSSIBLE D/C 1-2 days, DEPENDING ON CLINICAL  CONDITION.   Garin Mata M.D on 10/01/2017 at 8:46 AM  Between 7am to 6pm -  Pager - [phone] After 6pm go to [email or web address] - password [email or web address]  [facility]  Office  [phone]  CC: Primary care physician; Evelene Croon, MD  Note: This dictation was prepared with Dragon dictation along with smaller phrase technology. Any transcriptional errors that result from this process are unintentional.

## 2017-10-01 NOTE — Care Management Obs Status (Signed)
MEDICARE OBSERVATION STATUS NOTIFICATION   Patient Details  Name: Karen Russell MRN: [id number] Date of Birth: [birth year]   Medicare Observation Status Notification Given:  Yes    Gwenette Greet, RN 10/01/2017, 8:33 AM

## 2017-10-01 NOTE — Care Management Note (Signed)
Case Management Note  Patient Details  Name: Karen Russell MRN: [id number] Date of Birth: Oct 28, 1975  Subjective/Objective:  Admitted to [facility] under observation status with the diagnosis of COPD. Lives with Delton Coombes [phone]). Seen Dr. Lacie Scotts last summer. Prescriptions are filled at [facility] on [address]. Received assistance per [facility] program 05/28/17. Given application to [facility]. Did not follow-up. States she just just Disability and has Medicare A&B. Will need to get a drug plan. Gets $700.00 month.  Takes care of all basic  Activities of daily living herself.  Family/friend will transport.                   Action/Plan: [facility] application given   Expected Discharge Date:                  Expected Discharge Plan:     In-House Referral:   yes  Discharge planning Services     Post Acute Care Choice:    Choice offered to:     DME Arranged:    DME Agency:     HH Arranged:    HH Agency:     Status of Service:     If discussed at [employer] of Stay Meetings, dates discussed:    Additional Comments:  Gwenette Greet, RN MSN CCM Care Management [phone] 10/01/2017, 8:45 AM

## 2017-10-01 NOTE — Plan of Care (Signed)

## 2017-10-02 LAB — GLUCOSE, CAPILLARY
GLUCOSE-CAPILLARY: 118 mg/dL — AB (ref 65–99)
GLUCOSE-CAPILLARY: 153 mg/dL — AB (ref 65–99)

## 2017-10-02 MED ORDER — GABAPENTIN 400 MG PO CAPS: 400.0000 mg | ORAL_CAPSULE | Freq: Three times a day (TID) | ORAL | 0 refills | Status: DC

## 2017-10-02 MED ORDER — LEVOFLOXACIN 750 MG PO TABS: 750.0000 mg | ORAL_TABLET | Freq: Every day | ORAL | 0 refills | Status: AC

## 2017-10-02 MED ORDER — PREDNISONE 20 MG PO TABS: 40.0000 mg | ORAL_TABLET | Freq: Every day | ORAL | 0 refills | Status: AC

## 2017-10-02 MED ORDER — CYCLOBENZAPRINE HCL 10 MG PO TABS: 10.0000 mg | ORAL_TABLET | Freq: Three times a day (TID) | ORAL | 0 refills | Status: DC

## 2017-10-02 MED ORDER — IPRATROPIUM-ALBUTEROL 0.5-2.5 (3) MG/3ML IN SOLN: 3.0000 mL | Freq: Four times a day (QID) | RESPIRATORY_TRACT | 0 refills | Status: DC | PRN

## 2017-10-02 MED ORDER — MOMETASONE FURO-FORMOTEROL FUM 200-5 MCG/ACT IN AERO: 2.0000 | INHALATION_SPRAY | Freq: Two times a day (BID) | RESPIRATORY_TRACT | 0 refills | Status: DC

## 2017-10-02 MED ORDER — ATENOLOL 25 MG PO TABS: 25.0000 mg | ORAL_TABLET | Freq: Every day | ORAL | 1 refills | Status: DC

## 2017-10-02 MED ORDER — MELOXICAM 7.5 MG PO TABS: 7.5000 mg | ORAL_TABLET | Freq: Two times a day (BID) | ORAL | 0 refills | Status: DC

## 2017-10-02 NOTE — Discharge Summary (Signed)
[facility] [place] at [facility]   PATIENT NAME: Karen Russell    MR#:  [id number]  DATE OF BIRTH:  Oct 20, 1975  DATE OF ADMISSION:  09/30/2017 ADMITTING PHYSICIAN: Marguarite Arbour, MD  DATE OF DISCHARGE: 10/02/2017  PRIMARY CARE PHYSICIAN: Evelene Croon, MD    ADMISSION DIAGNOSIS:  COPD exacerbation (HCC) [J44.1]  DISCHARGE DIAGNOSIS:  Principal Problem:   COPD exacerbation (HCC) Active Problems:   Acute bronchitis   HTN (hypertension)   Respiratory distress, acute   SECONDARY DIAGNOSIS:   Past Medical History:  Diagnosis Date  . Arthritis   . COPD (chronic obstructive pulmonary disease) (HCC)   . Fibromyalgia syndrome   . Hyperlipemia   . Hypertension     HOSPITAL COURSE:   42 year old female with history of COPD and tobacco dependence who presents with shortness of breath.  1.  Acute exacerbation of COPD due to acute bronchitis  Symptoms have subsided.  She will be discharged on antibiotics and prednisone.  She will continue with inhalers and nebulizer treatment.    2.Tobacco dependence: Patient is encouraged to quit smoking. Counseling was provided for 4 minutes.  3.  Essential hypertension: Continue atenolol     DISCHARGE CONDITIONS AND DIET:   Stable for discharge on regular diet  CONSULTS OBTAINED:    DRUG ALLERGIES:  No Known Allergies  DISCHARGE MEDICATIONS:   Allergies as of 10/02/2017   No Known Allergies     Medication List    STOP taking these medications   albuterol (2.5 MG/3ML) 0.083% nebulizer solution Commonly known as:  PROVENTIL   azithromycin 250 MG tablet Commonly known as:  ZITHROMAX Z-PAK   benzonatate 100 MG capsule Commonly known as:  TESSALON PERLES   chlorpheniramine-HYDROcodone 10-8 MG/5ML Suer Commonly known as:  TUSSIONEX PENNKINETIC ER   HYDROcodone-acetaminophen 5-325 MG tablet Commonly known as:  NORCO/VICODIN     TAKE these medications   atenolol 25 MG tablet Commonly  known as:  TENORMIN Take 1 tablet (25 mg total) by mouth daily.   cyclobenzaprine 10 MG tablet Commonly known as:  FLEXERIL Take 1 tablet (10 mg total) by mouth 3 (three) times daily.   gabapentin 400 MG capsule Commonly known as:  NEURONTIN Take 1 capsule (400 mg total) by mouth 3 (three) times daily.   ipratropium-albuterol 0.5-2.5 (3) MG/3ML Soln Commonly known as:  DUONEB Take 3 mLs by nebulization every 6 (six) hours as needed.   levofloxacin 750 MG tablet Commonly known as:  LEVAQUIN Take 1 tablet (750 mg total) by mouth daily for 3 days. Start taking on:  10/03/2017   meloxicam 7.5 MG tablet Commonly known as:  MOBIC Take 1 tablet (7.5 mg total) by mouth 2 (two) times daily.   mometasone-formoterol 200-5 MCG/ACT Aero Commonly known as:  DULERA Inhale 2 puffs into the lungs 2 (two) times daily.   predniSONE 20 MG tablet Commonly known as:  DELTASONE Take 2 tablets (40 mg total) by mouth daily with breakfast for 4 days. What changed:    how much to take  how to take this  when to take this  additional instructions  Another medication with the same name was removed. Continue taking this medication, and follow the directions you see here.         Today   CHIEF COMPLAINT:   Patient is feeling better this morning.  Ready for discharge today   VITAL SIGNS:  Blood pressure 114/65, pulse (!) 103, temperature 98 F (36.7 C), temperature source Oral,  resp. rate 20, height  (1.575 m), weight 77.7 kg (171 lb 6.4 oz), SpO2 93 %.   REVIEW OF SYSTEMS:  Review of Systems  Constitutional: Negative.  Negative for chills, fever and malaise/fatigue.  HENT: Negative.  Negative for ear discharge, ear pain, hearing loss, nosebleeds and sore throat.   Eyes: Negative.  Negative for blurred vision and pain.  Respiratory: Positive for cough. Negative for hemoptysis, shortness of breath and wheezing.   Cardiovascular: Negative.  Negative for chest pain, palpitations  and leg swelling.  Gastrointestinal: Negative.  Negative for abdominal pain, blood in stool, diarrhea, nausea and vomiting.  Genitourinary: Negative.  Negative for dysuria.  Musculoskeletal: Negative.  Negative for back pain.  Skin: Negative.   Neurological: Negative for dizziness, tremors, speech change, focal weakness, seizures and headaches.  Endo/Heme/Allergies: Negative.  Does not bruise/bleed easily.  Psychiatric/Behavioral: Negative.  Negative for depression, hallucinations and suicidal ideas.     PHYSICAL EXAMINATION:  GENERAL:  42 y.o.-year-old patient lying in the bed with no acute distress.  NECK:  Supple, no jugular venous distention. No thyroid enlargement, no tenderness.  LUNGS: Normal respiratory effort with minimal bilateral wheezing CARDIOVASCULAR: S1, S2 normal. No murmurs, rubs, or gallops.  ABDOMEN: Soft, non-tender, non-distended. Bowel sounds present. No organomegaly or mass.  EXTREMITIES: No pedal edema, cyanosis, or clubbing.  PSYCHIATRIC: The patient is alert and oriented x 3.  SKIN: No obvious rash, lesion, or ulcer.   DATA REVIEW:   CBC Recent Labs  Lab 10/01/17 0306  WBC 12.3*  HGB 15.2  HCT 44.1  PLT 500*    Chemistries  Recent Labs  Lab 10/01/17 0306  NA 137  K 4.5  CL 106  CO2 23  GLUCOSE 139*  BUN 10  CREATININE 0.94  CALCIUM 9.1  AST 35  ALT 26  ALKPHOS 95  BILITOT 0.3    Cardiac Enzymes Recent Labs  Lab 09/30/17 1622  TROPONINI <0.03    Microbiology Results  @  RADIOLOGY:  Dg Chest 2 View  Result Date: 09/30/2017 CLINICAL DATA:  Patient with shortness of breath and productive cough. EXAM: CHEST - 2 VIEW COMPARISON:  Chest radiograph 08/30/2017. FINDINGS: Monitoring leads overlie the patient. Normal cardiac and mediastinal contours. No consolidative pulmonary opacities. No pleural effusion or pneumothorax. Regional skeleton is unremarkable. IMPRESSION: No acute cardiopulmonary process. Electronically Signed    By: Annia Belt M.D.   On: 09/30/2017 17:16      Allergies as of 10/02/2017   No Known Allergies     Medication List    STOP taking these medications   albuterol (2.5 MG/3ML) 0.083% nebulizer solution Commonly known as:  PROVENTIL   azithromycin 250 MG tablet Commonly known as:  ZITHROMAX Z-PAK   benzonatate 100 MG capsule Commonly known as:  TESSALON PERLES   chlorpheniramine-HYDROcodone 10-8 MG/5ML Suer Commonly known as:  TUSSIONEX PENNKINETIC ER   HYDROcodone-acetaminophen 5-325 MG tablet Commonly known as:  NORCO/VICODIN     TAKE these medications   atenolol 25 MG tablet Commonly known as:  TENORMIN Take 1 tablet (25 mg total) by mouth daily.   cyclobenzaprine 10 MG tablet Commonly known as:  FLEXERIL Take 1 tablet (10 mg total) by mouth 3 (three) times daily.   gabapentin 400 MG capsule Commonly known as:  NEURONTIN Take 1 capsule (400 mg total) by mouth 3 (three) times daily.   ipratropium-albuterol 0.5-2.5 (3) MG/3ML Soln Commonly known as:  DUONEB Take 3 mLs by nebulization every 6 (six) hours as needed.  levofloxacin 750 MG tablet Commonly known as:  LEVAQUIN Take 1 tablet (750 mg total) by mouth daily for 3 days. Start taking on:  10/03/2017   meloxicam 7.5 MG tablet Commonly known as:  MOBIC Take 1 tablet (7.5 mg total) by mouth 2 (two) times daily.   mometasone-formoterol 200-5 MCG/ACT Aero Commonly known as:  DULERA Inhale 2 puffs into the lungs 2 (two) times daily.   predniSONE 20 MG tablet Commonly known as:  DELTASONE Take 2 tablets (40 mg total) by mouth daily with breakfast for 4 days. What changed:    how much to take  how to take this  when to take this  additional instructions  Another medication with the same name was removed. Continue taking this medication, and follow the directions you see here.          Management plans discussed with the patient and she is in agreement. Stable for discharge home  Patient should  follow up with pcp  CODE STATUS:     Code Status Orders  (From admission, onward)        Start     Ordered   09/30/17 1839  Full code  Continuous     09/30/17 1838    Code Status History    Date Active Date Inactive Code Status Order ID Comments User Context   05/25/2017 2149 05/28/2017 1557 Full Code [id number]  Bertrum Sol, MD Inpatient   08/10/2016 1821 08/13/2016 1738 Full Code [id number]  Enedina Finner, MD Inpatient      TOTAL TIME TAKING CARE OF THIS PATIENT: 38 minutes.    Note: This dictation was prepared with Dragon dictation along with smaller phrase technology. Any transcriptional errors that result from this process are unintentional.  Fritz Cauthon M.D on 10/02/2017 at 10:44 AM  Between 7am to 6pm - Pager - [phone] After 6pm go to [email or web address] - Social research officer, government  [facility] [place] [facility]  Office  [phone]  CC: Primary care physician; Evelene Croon, MD

## 2017-10-02 NOTE — Care Management (Signed)
Spoke with Dr. Juliene Pina. Possible discharge today. Discussed Medication Management application again. States "they want give me all my medications." Discussed they Medication Management would help with medications as much as they can.  Discussed going to the Department of Social Services and applying for Medicaid. Sees Dr. Lacie Scotts as primary and has nebulizer in the home. Gwenette Greet RN MSN CCM Care Management [phone]

## 2017-10-02 NOTE — Progress Notes (Signed)
Pt is being discharged home. Discharge papers given and explained to pt, verbalized understanding. Meds and f/u appointment reviewed. RX given to pt.

## 2017-10-02 NOTE — Progress Notes (Signed)
SATURATION QUALIFICATIONS: (This note is used to comply with regulatory documentation for home oxygen) ° °Patient Saturations on Room Air at Rest = 93% ° °Patient Saturations on Room Air while Ambulating = 94% ° °Patient Saturations on 0 Liters of oxygen while Ambulating = 94% ° °Please briefly explain why patient needs home oxygen: °

## 2017-12-20 ENCOUNTER — Ambulatory Visit
Payer: Medicare Other | Attending: Student in an Organized Health Care Education/Training Program | Admitting: Student in an Organized Health Care Education/Training Program

## 2018-01-24 DIAGNOSIS — E559 Vitamin D deficiency, unspecified: Secondary | ICD-10-CM | POA: Insufficient documentation

## 2018-01-24 DIAGNOSIS — M2550 Pain in unspecified joint: Secondary | ICD-10-CM | POA: Insufficient documentation

## 2018-01-24 DIAGNOSIS — M170 Bilateral primary osteoarthritis of knee: Secondary | ICD-10-CM | POA: Insufficient documentation

## 2018-01-28 ENCOUNTER — Other Ambulatory Visit: Payer: Self-pay | Admitting: Internal Medicine

## 2018-01-28 DIAGNOSIS — G8929 Other chronic pain: Secondary | ICD-10-CM

## 2018-01-28 DIAGNOSIS — M25562 Pain in left knee: Principal | ICD-10-CM

## 2018-01-28 DIAGNOSIS — M25561 Pain in right knee: Principal | ICD-10-CM

## 2018-02-14 ENCOUNTER — Ambulatory Visit
Admission: RE | Admit: 2018-02-14 | Discharge: 2018-02-14 | Disposition: A | Discharge status: Home / Self Care | Payer: Medicare Other | Source: Ambulatory Visit | Attending: Internal Medicine | Admitting: Internal Medicine

## 2018-02-14 DIAGNOSIS — M25562 Pain in left knee: Secondary | ICD-10-CM | POA: Diagnosis present

## 2018-02-14 DIAGNOSIS — M1711 Unilateral primary osteoarthritis, right knee: Secondary | ICD-10-CM | POA: Insufficient documentation

## 2018-02-14 DIAGNOSIS — M25561 Pain in right knee: Secondary | ICD-10-CM | POA: Diagnosis present

## 2018-02-14 DIAGNOSIS — G8929 Other chronic pain: Secondary | ICD-10-CM

## 2018-02-14 DIAGNOSIS — M7121 Synovial cyst of popliteal space [Baker], right knee: Secondary | ICD-10-CM | POA: Insufficient documentation

## 2018-03-07 DIAGNOSIS — M7121 Synovial cyst of popliteal space [Baker], right knee: Secondary | ICD-10-CM | POA: Insufficient documentation

## 2018-06-01 ENCOUNTER — Emergency Department: Payer: Medicare Other

## 2018-06-01 ENCOUNTER — Other Ambulatory Visit: Payer: Self-pay

## 2018-06-01 ENCOUNTER — Emergency Department
Admission: EM | Admit: 2018-06-01 | Discharge: 2018-06-01 | Disposition: A | Discharge status: Home / Self Care | Payer: Medicare Other | Attending: Emergency Medicine | Admitting: Emergency Medicine

## 2018-06-01 DIAGNOSIS — F17210 Nicotine dependence, cigarettes, uncomplicated: Secondary | ICD-10-CM | POA: Insufficient documentation

## 2018-06-01 DIAGNOSIS — I10 Essential (primary) hypertension: Secondary | ICD-10-CM | POA: Insufficient documentation

## 2018-06-01 DIAGNOSIS — J441 Chronic obstructive pulmonary disease with (acute) exacerbation: Secondary | ICD-10-CM

## 2018-06-01 DIAGNOSIS — Z720 Tobacco use: Secondary | ICD-10-CM

## 2018-06-01 DIAGNOSIS — R0602 Shortness of breath: Secondary | ICD-10-CM | POA: Diagnosis present

## 2018-06-01 DIAGNOSIS — Z79899 Other long term (current) drug therapy: Secondary | ICD-10-CM | POA: Insufficient documentation

## 2018-06-01 LAB — FIBRIN DERIVATIVES D-DIMER (ARMC ONLY): Fibrin derivatives D-dimer (ARMC): 507.36 ng/mL (FEU) — ABNORMAL HIGH (ref 0.00–499.00)

## 2018-06-01 LAB — BASIC METABOLIC PANEL
Anion gap: 7 (ref 5–15)
BUN: 10 mg/dL (ref 6–20)
CO2: 20 mmol/L — ABNORMAL LOW (ref 22–32)
Calcium: 9 mg/dL (ref 8.9–10.3)
Chloride: 107 mmol/L (ref 98–111)
Creatinine, Ser: 0.78 mg/dL (ref 0.44–1.00)
GFR calc Af Amer: >60 mL/min (ref >60)
GFR calc non Af Amer: >60 mL/min (ref >60)
Glucose, Bld: 111 mg/dL — ABNORMAL HIGH (ref 70–99)
POTASSIUM: 3.5 mmol/L (ref 3.5–5.1)
Sodium: 134 mmol/L — ABNORMAL LOW (ref 135–145)

## 2018-06-01 LAB — TROPONIN I: Troponin I: <0.03 ng/mL (ref <0.03)

## 2018-06-01 LAB — CBC
HEMATOCRIT: 44.2 % (ref 36.0–46.0)
HEMOGLOBIN: 15.1 g/dL — AB (ref 12.0–15.0)
MCH: 31.1 pg (ref 26.0–34.0)
MCHC: 34.2 g/dL (ref 30.0–36.0)
MCV: 90.9 fL (ref 80.0–100.0)
Platelets: 327 10*3/uL (ref 150–400)
RBC: 4.86 MIL/uL (ref 3.87–5.11)
RDW: 12.6 % (ref 11.5–15.5)
WBC: 19.9 10*3/uL — ABNORMAL HIGH (ref 4.0–10.5)
nRBC: 0 % (ref 0.0–0.2)

## 2018-06-01 LAB — BRAIN NATRIURETIC PEPTIDE: B Natriuretic Peptide: 44 pg/mL (ref 0.0–100.0)

## 2018-06-01 MED ORDER — IOPAMIDOL (ISOVUE-370) INJECTION 76%
75.0000 mL | Freq: Once | INTRAVENOUS | Status: AC | PRN
  Administered 2018-06-01: 75 mL via INTRAVENOUS

## 2018-06-01 MED ORDER — IPRATROPIUM-ALBUTEROL 0.5-2.5 (3) MG/3ML IN SOLN: 3.0000 mL | Freq: Four times a day (QID) | RESPIRATORY_TRACT | 0 refills | Status: DC | PRN

## 2018-06-01 MED ORDER — ALBUTEROL SULFATE (2.5 MG/3ML) 0.083% IN NEBU
5.0000 mg | INHALATION_SOLUTION | Freq: Once | RESPIRATORY_TRACT | Status: AC
  Administered 2018-06-01: 5 mg via RESPIRATORY_TRACT
  Filled 2018-06-01: qty 6

## 2018-06-01 MED ORDER — IPRATROPIUM-ALBUTEROL 0.5-2.5 (3) MG/3ML IN SOLN
3.0000 mL | Freq: Once | RESPIRATORY_TRACT | Status: AC
  Administered 2018-06-01: 3 mL via RESPIRATORY_TRACT
  Filled 2018-06-01: qty 3

## 2018-06-01 MED ORDER — PREDNISONE 20 MG PO TABS
60.0000 mg | ORAL_TABLET | Freq: Once | ORAL | Status: AC
  Administered 2018-06-01: 60 mg via ORAL
  Filled 2018-06-01: qty 3

## 2018-06-01 MED ORDER — SODIUM CHLORIDE 0.9 % IV BOLUS
1000.0000 mL | Freq: Once | INTRAVENOUS | Status: AC
  Administered 2018-06-01: 1000 mL via INTRAVENOUS

## 2018-06-01 MED ORDER — OXYCODONE-ACETAMINOPHEN 5-325 MG PO TABS
1.0000 | ORAL_TABLET | Freq: Once | ORAL | Status: AC
  Administered 2018-06-01: 1 via ORAL
  Filled 2018-06-01: qty 1

## 2018-06-01 MED ORDER — AZITHROMYCIN 500 MG PO TABS
500.0000 mg | ORAL_TABLET | Freq: Once | ORAL | Status: AC
  Administered 2018-06-01: 500 mg via ORAL
  Filled 2018-06-01: qty 1

## 2018-06-01 MED ORDER — IPRATROPIUM-ALBUTEROL 0.5-2.5 (3) MG/3ML IN SOLN
RESPIRATORY_TRACT | Status: AC
  Filled 2018-06-01: qty 3

## 2018-06-01 MED ORDER — PREDNISONE 20 MG PO TABS: 60.0000 mg | ORAL_TABLET | Freq: Every day | ORAL | 0 refills | Status: DC

## 2018-06-01 MED ORDER — AZITHROMYCIN 250 MG PO TABS: ORAL_TABLET | ORAL | 0 refills | Status: DC

## 2018-06-01 MED ORDER — IPRATROPIUM-ALBUTEROL 0.5-2.5 (3) MG/3ML IN SOLN
3.0000 mL | Freq: Once | RESPIRATORY_TRACT | Status: AC
  Administered 2018-06-01: 3 mL via RESPIRATORY_TRACT

## 2018-06-01 NOTE — ED Provider Notes (Signed)
CT angio PE without evidence of PE. 9 mm left breast nodule. Discussed findings with the patient. Discussed importance of follow up with mammagram. She did feel comfortable going home after breathing treatments. Will discharge.    Phineas SemenGoodman, Candela Krul, MD 06/01/18 2204

## 2018-06-01 NOTE — ED Triage Notes (Signed)
[facility]. Pt states she has been sick for couple of days and yesterday it got worse.  Pt has hx of COPD. Pt states pack a day smoker but hasn't smoked since yesterday.  Pt has audible wheezing.  Pt states pain in her lungs 8/10

## 2018-06-01 NOTE — ED Provider Notes (Addendum)
[facility] Emergency Department Provider Note  ____________________________________________   I have reviewed the triage vital signs and the nursing notes. Where available I have reviewed prior notes and, if possible and indicated, outside hospital notes.    HISTORY  Chief Complaint Shortness of Breath    HPI Karen Russell is a 42 y.o. female with a history of tobacco abuse, presents today complaining of cough and wheeze for the last 4 days.  No fevers.  As of rhinorrhea positive mild sore throat, multiple sick contacts with similar.  Cough is occasionally productive.  Was on steroids a few weeks ago.   Have a history of COPD.  Does continue to smoke unfortunately.  States it hurts to cough sometimes.  No other lung pain, no other chest pain no leg pain no leg swelling no recent travel no exertional symptoms.  Did receive a nebulizer on the way in and it seemed to help she states her breathing is not getting better and is less uncomfortable to breathe.  Patient has no personal or family history of PE or DVT no leg swelling no recent travel she is not on birth control.  This feels like prior COPD flares to her.  Has been using her home albuterol with some relief.   Past Medical History:  Diagnosis Date  . Arthritis   . COPD (chronic obstructive pulmonary disease) (HCC)   . Fibromyalgia syndrome   . Hyperlipemia   . Hypertension     Patient Active Problem List   Diagnosis Date Noted  . Acute bronchitis 09/30/2017  . HTN (hypertension) 09/30/2017  . Respiratory distress, acute 09/30/2017  . COPD exacerbation (HCC) 05/25/2017  . Respiratory failure with hypoxia (HCC) 08/10/2016    Past Surgical History:  Procedure Laterality Date  . KIDNEY SURGERY    . TUBAL LIGATION      Prior to Admission medications   Medication Sig Start Date End Date Taking? Authorizing Provider  atenolol (TENORMIN) 25 MG tablet Take 1 tablet (25 mg total) by mouth daily.  10/02/17   Adrian SaranMody, Sital, MD  cyclobenzaprine (FLEXERIL) 10 MG tablet Take 1 tablet (10 mg total) by mouth 3 (three) times daily. 10/02/17   Adrian SaranMody, Sital, MD  gabapentin (NEURONTIN) 400 MG capsule Take 1 capsule (400 mg total) by mouth 3 (three) times daily. 10/02/17   Adrian SaranMody, Sital, MD  ipratropium-albuterol (DUONEB) 0.5-2.5 (3) MG/3ML SOLN Take 3 mLs by nebulization every 6 (six) hours as needed. 10/02/17   Adrian SaranMody, Sital, MD  meloxicam (MOBIC) 7.5 MG tablet Take 1 tablet (7.5 mg total) by mouth 2 (two) times daily. 10/02/17   Adrian SaranMody, Sital, MD  mometasone-formoterol (DULERA) 200-5 MCG/ACT AERO Inhale 2 puffs into the lungs 2 (two) times daily. 10/02/17 11/01/17  Adrian SaranMody, Sital, MD    Allergies Patient has no known allergies.  No family history on file.  Social History Social History   Tobacco Use  . Smoking status: Current Every Day Smoker    Packs/day: 0.50    Types: Cigarettes  . Smokeless tobacco: Never Used  Substance Use Topics  . Alcohol use: No  . Drug use: No    Review of Systems Constitutional: No fever/chills Eyes: No visual changes. ENT: Positive mild sore throat positive rhinorrhea No stiff neck no neck pain Cardiovascular: Denies chest pain. Respiratory: See HPI Gastrointestinal:   no vomiting.  No diarrhea.  No constipation. Genitourinary: Negative for dysuria. Musculoskeletal: Negative lower extremity swelling Skin: Negative for rash. Neurological: Negative for severe headaches, focal  weakness or numbness.   ____________________________________________   PHYSICAL EXAM:  VITAL SIGNS: ED Triage Vitals  Enc Vitals Group     BP 06/01/18 1252 (!) 145/81     Pulse Rate 06/01/18 1252 (!) 120     Resp 06/01/18 1252 18     Temp 06/01/18 1252 99.1 F (37.3 C)     Temp Source 06/01/18 1252 Oral     SpO2 06/01/18 1252 93 %     Weight 06/01/18 1252 155 lb (70.3 kg)     Height 06/01/18 1252 5\' 2"  (1.575 m)     Head Circumference --      Peak Flow --      Pain Score 06/01/18  1256 8     Pain Loc --      Pain Edu? --      Excl. in GC? --     Constitutional: Alert and oriented. Well appearing and in no acute distress. Eyes: Conjunctivae are normal Head: Atraumatic HEENT: Positive clear congestion/rhinnorhea. Mucous membranes are moist.  Pharynx is slightly erythematous  neck:   Nontender with no meningismus, no masses, no stridor Cardiovascular: Normal rate, regular rhythm. Grossly normal heart sounds.  Good peripheral circulation. Respiratory: Normal respiratory effort.  No retractions.  Diffuse wheeze appreciated, also rhonchi which clear with cough which is also present Abdominal: Soft and nontender. No distention. No guarding no rebound Back:  There is no focal tenderness or step off.  there is no midline tenderness there are no lesions noted. there is no CVA tenderness Musculoskeletal: No lower extremity tenderness, no upper extremity tenderness. No joint effusions, no DVT signs strong distal pulses no edema Neurologic:  Normal speech and language. No gross focal neurologic deficits are appreciated.  Skin:  Skin is warm, dry and intact. No rash noted. Psychiatric: Mood and affect are little tearful she states that she does not want to be sick and she is worried about her husband no SI or HI. Speech and behavior are normal.  ____________________________________________   LABS (all labs ordered are listed, but only abnormal results are displayed)  Labs Reviewed  BASIC METABOLIC PANEL - Abnormal; Notable for the following components:      Result Value   Sodium 134 (*)    CO2 20 (*)    Glucose, Bld 111 (*)    All other components within normal limits  CBC - Abnormal; Notable for the following components:   WBC 19.9 (*)    Hemoglobin 15.1 (*)    All other components within normal limits  TROPONIN I  BRAIN NATRIURETIC PEPTIDE  POC URINE PREG, ED    Pertinent labs  results that were available during my care of the patient were reviewed by me and  considered in my medical decision making (see chart for details). ____________________________________________  EKG  I personally interpreted any EKGs ordered by me or triage Sinus tach rate 120, normal axis no acute ST elevation or depression _______________________________________  RADIOLOGY  Pertinent labs & imaging results that were available during my care of the patient were reviewed by me and considered in my medical decision making (see chart for details). If possible, patient and/or family made aware of any abnormal findings.  No results found. ____________________________________________    PROCEDURES  Procedure(s) performed: None  Procedures  Critical Care performed: None  ____________________________________________   INITIAL IMPRESSION / ASSESSMENT AND PLAN / ED COURSE  Pertinent labs & imaging results that were available during my care of the patient were reviewed by  me and considered in my medical decision making (see chart for details).  Patient here with COPD history, presents with cough and wheeze.  Chest x-ray to my read is negative, awaiting radiology report.  Blood work shows elevated white count which is nonspecific but given her significant tobacco abuse history, I may give her atypical coverage.  Patient has been on steroids, very low-dose she states, recently but no longer is.  We will give her steroids here.  I will give her another treatment to see if she clears, I do not think this likely represents a PE, patient has clear COPD findings on exam.  No significant risk factors for PE. Is my hope that after steroids and treatment she will feel better.  ----------------------------------------- 2:46 PM on 06/01/2018 -----------------------------------------  Feeling better, better air movement.  Because of elevated white count, progressive symptoms, and significant tobacco abuse history I will treat her for atypical infection although this certainly  could be viral.  Low suspicion again for PE but she remains tachycardic and she is not complaining of some pleuritic lung pain after nebs.  We will give her pain medication, and I will send a d-dimer again I think this is most likely COPD.  Signed out to Dr. Derrill KayGoodman at the end of my shift   ____________________________________________   FINAL CLINICAL IMPRESSION(S) / ED DIAGNOSES  Final diagnoses:  None      This chart was dictated using voice recognition software.  Despite best efforts to proofread,  errors can occur which can change meaning.      Jeanmarie PlantMcShane, James A, MD 06/01/18 1405    Jeanmarie PlantMcShane, James A, MD 06/01/18 1447    Jeanmarie PlantMcShane, James A, MD 06/01/18 [phone]

## 2018-06-01 NOTE — Discharge Instructions (Addendum)
Please talk to your doctor about obtaining a mammogram. Please seek medical attention for any high fevers, chest pain, shortness of breath, change in behavior, persistent vomiting, bloody stool or any other new or concerning symptoms.

## 2018-06-01 NOTE — ED Notes (Signed)
Patient transported to CT 

## 2018-06-10 IMAGING — CR DG CHEST 2V
1 series · 2 of 2 positions shown · non-contrast
Comparison: 05/05/2017

CLINICAL DATA: Cough, shortness of breath

EXAM:
CHEST  2 VIEW

[Series 1: dg chest 2 view · 0.14mm/px · 2 of 2 slices shown]
[im 1/2]
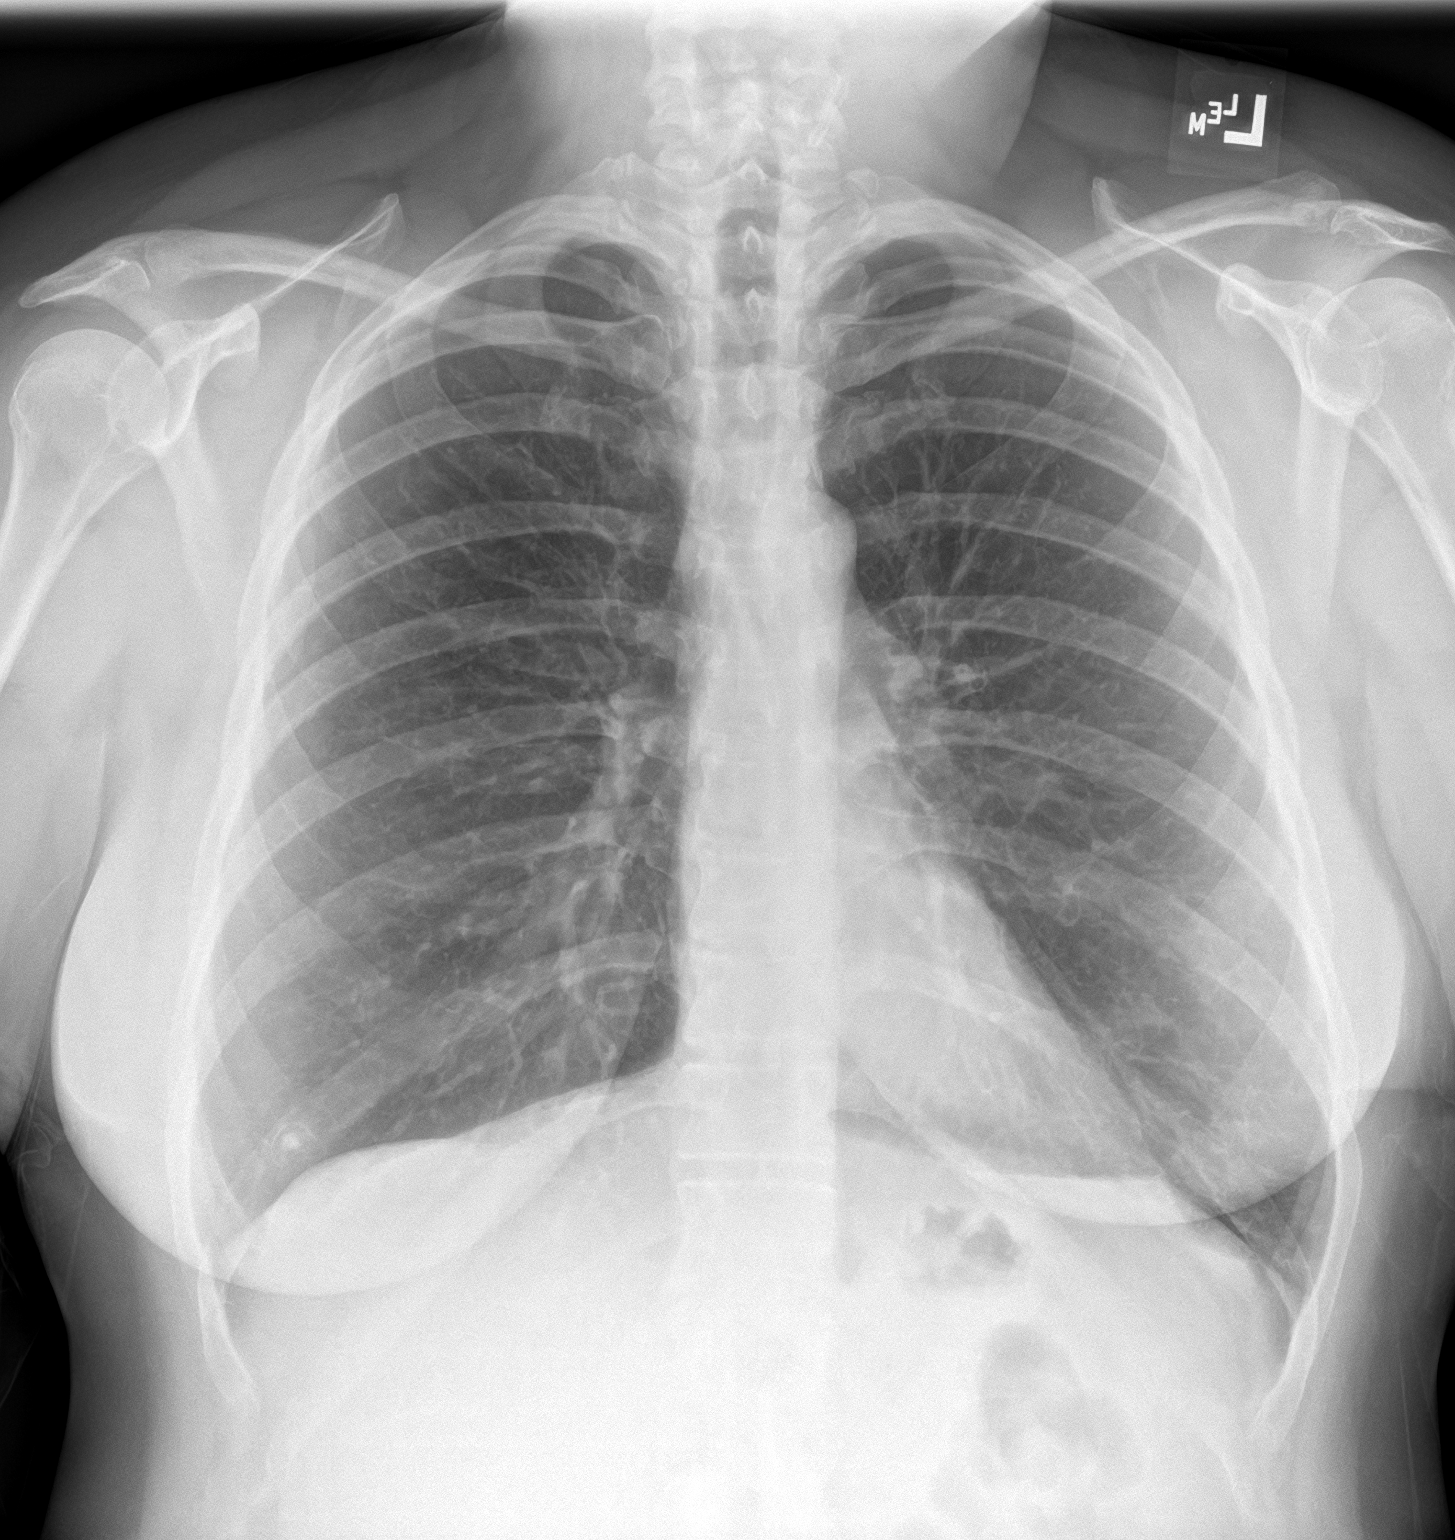
[im 2/2]
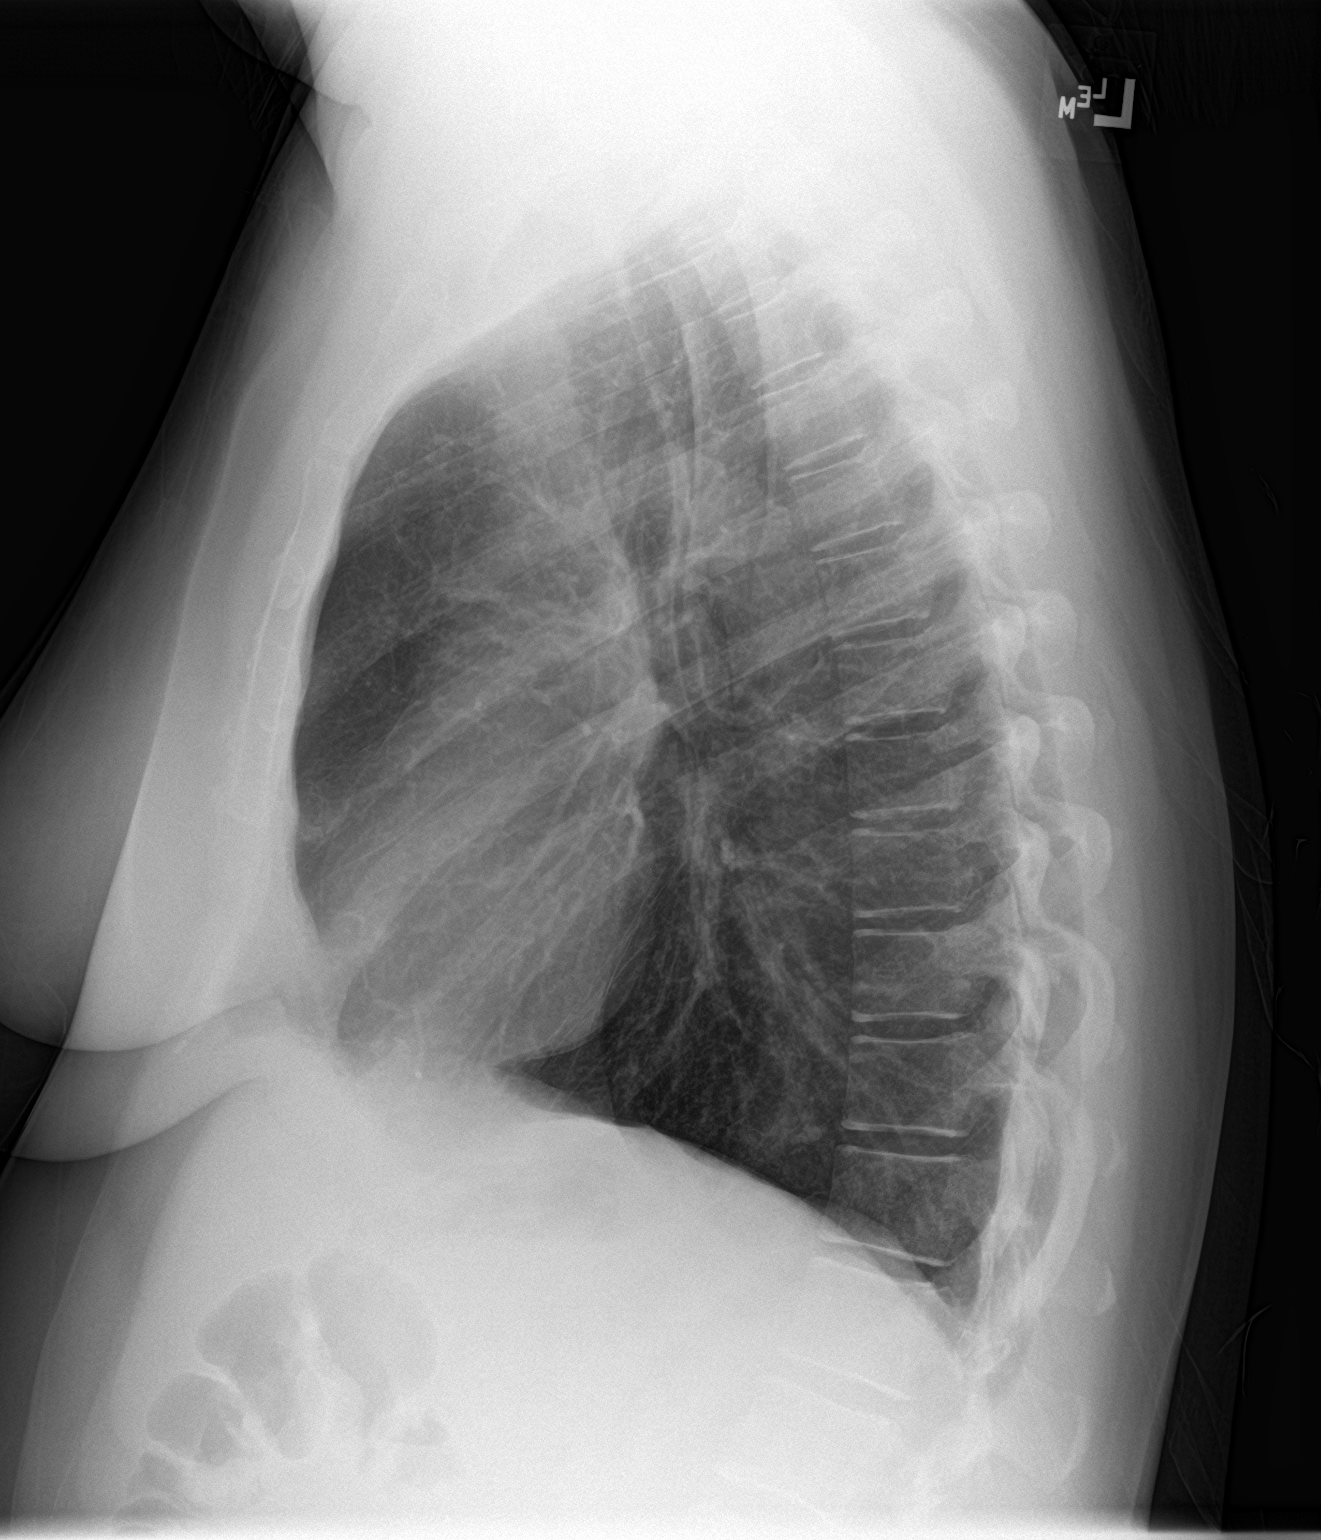

[2 of 2 positions shown; findings below may reference images not displayed]

FINDINGS: The heart size and mediastinal contours are within normal limits.
Both lungs are clear. The visualized skeletal structures are
unremarkable.
IMPRESSION: No active cardiopulmonary disease.

## 2018-06-17 ENCOUNTER — Other Ambulatory Visit: Payer: Self-pay | Admitting: Family Medicine

## 2018-06-17 DIAGNOSIS — Z1231 Encounter for screening mammogram for malignant neoplasm of breast: Secondary | ICD-10-CM

## 2018-08-27 ENCOUNTER — Ambulatory Visit
Admission: RE | Admit: 2018-08-27 | Discharge: 2018-08-27 | Disposition: A | Discharge status: Home / Self Care | Payer: Medicare Other | Source: Ambulatory Visit | Attending: Family Medicine | Admitting: Family Medicine

## 2018-08-27 ENCOUNTER — Other Ambulatory Visit: Payer: Self-pay

## 2018-08-27 DIAGNOSIS — Z1231 Encounter for screening mammogram for malignant neoplasm of breast: Secondary | ICD-10-CM | POA: Diagnosis present

## 2018-08-28 ENCOUNTER — Other Ambulatory Visit: Payer: Self-pay | Admitting: Family Medicine

## 2018-08-28 DIAGNOSIS — R928 Other abnormal and inconclusive findings on diagnostic imaging of breast: Secondary | ICD-10-CM

## 2018-08-28 DIAGNOSIS — N6320 Unspecified lump in the left breast, unspecified quadrant: Secondary | ICD-10-CM

## 2018-09-04 ENCOUNTER — Ambulatory Visit
Admission: RE | Admit: 2018-09-04 | Discharge: 2018-09-04 | Disposition: A | Discharge status: Home / Self Care | Payer: Medicare Other | Source: Ambulatory Visit | Attending: Family Medicine | Admitting: Family Medicine

## 2018-09-04 ENCOUNTER — Other Ambulatory Visit: Payer: Self-pay | Admitting: Family Medicine

## 2018-09-04 ENCOUNTER — Other Ambulatory Visit: Payer: Self-pay

## 2018-09-04 DIAGNOSIS — R928 Other abnormal and inconclusive findings on diagnostic imaging of breast: Secondary | ICD-10-CM

## 2018-09-04 DIAGNOSIS — N6489 Other specified disorders of breast: Secondary | ICD-10-CM

## 2018-09-04 DIAGNOSIS — N6320 Unspecified lump in the left breast, unspecified quadrant: Secondary | ICD-10-CM

## 2018-09-05 ENCOUNTER — Ambulatory Visit
Admission: RE | Admit: 2018-09-05 | Discharge: 2018-09-05 | Disposition: A | Discharge status: Home / Self Care | Payer: Medicare Other | Source: Ambulatory Visit | Attending: Family Medicine | Admitting: Family Medicine

## 2018-09-05 DIAGNOSIS — N6320 Unspecified lump in the left breast, unspecified quadrant: Secondary | ICD-10-CM | POA: Insufficient documentation

## 2018-09-05 DIAGNOSIS — N6489 Other specified disorders of breast: Secondary | ICD-10-CM

## 2018-09-05 DIAGNOSIS — R928 Other abnormal and inconclusive findings on diagnostic imaging of breast: Secondary | ICD-10-CM

## 2018-09-05 HISTORY — PX: BREAST BIOPSY: SHX20

## 2018-09-06 LAB — SURGICAL PATHOLOGY

## 2019-09-12 IMAGING — MG DIGITAL SCREENING BILATERAL MAMMOGRAM WITH TOMO AND CAD
6 of 10 series · 6 of 30 positions shown · non-contrast
Comparison: None.

CLINICAL DATA: Screening. This is the patient's initial baseline
mammogram.

EXAM:
DIGITAL SCREENING BILATERAL MAMMOGRAM WITH TOMO AND CAD

[R CC synth-2D]
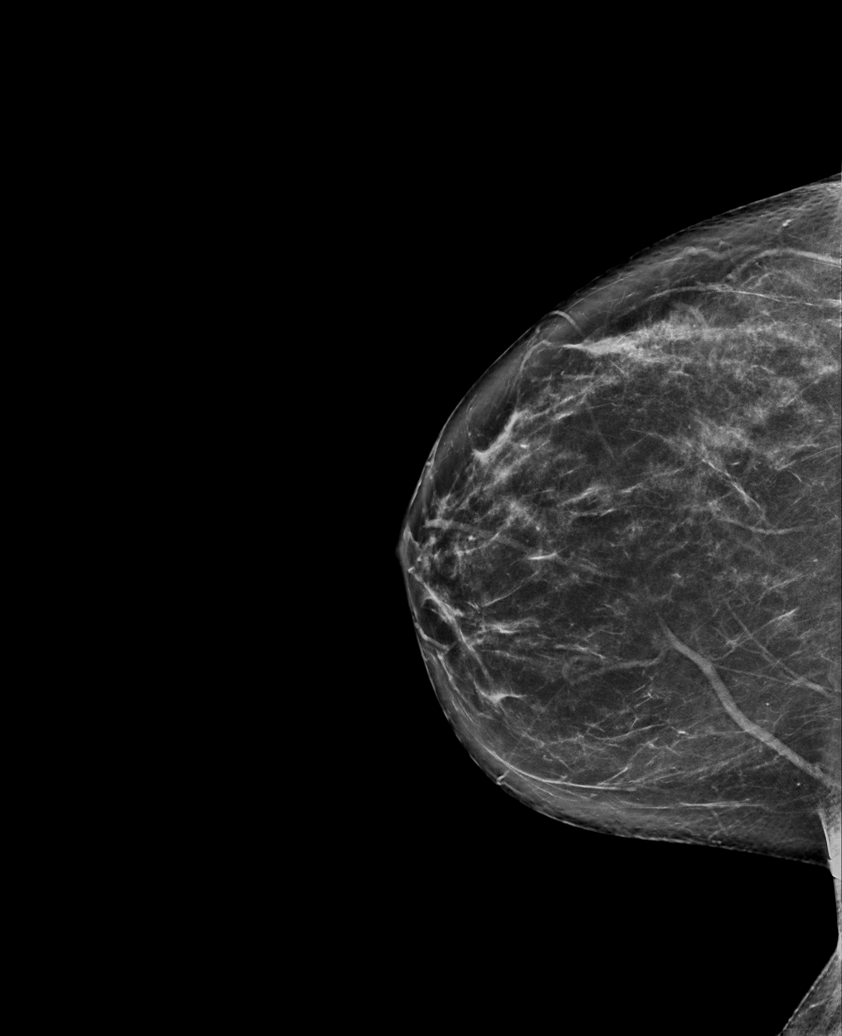

[L CC synth-2D]
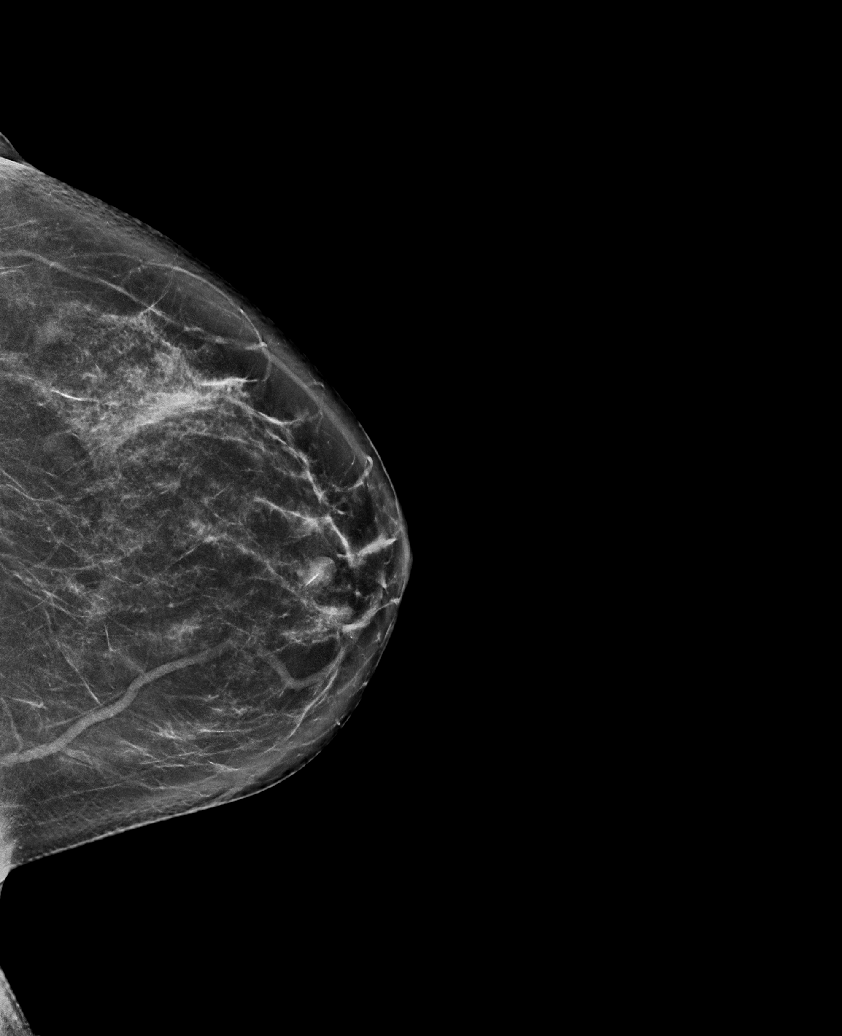

[L MLO synth-2D (1 of 2)]
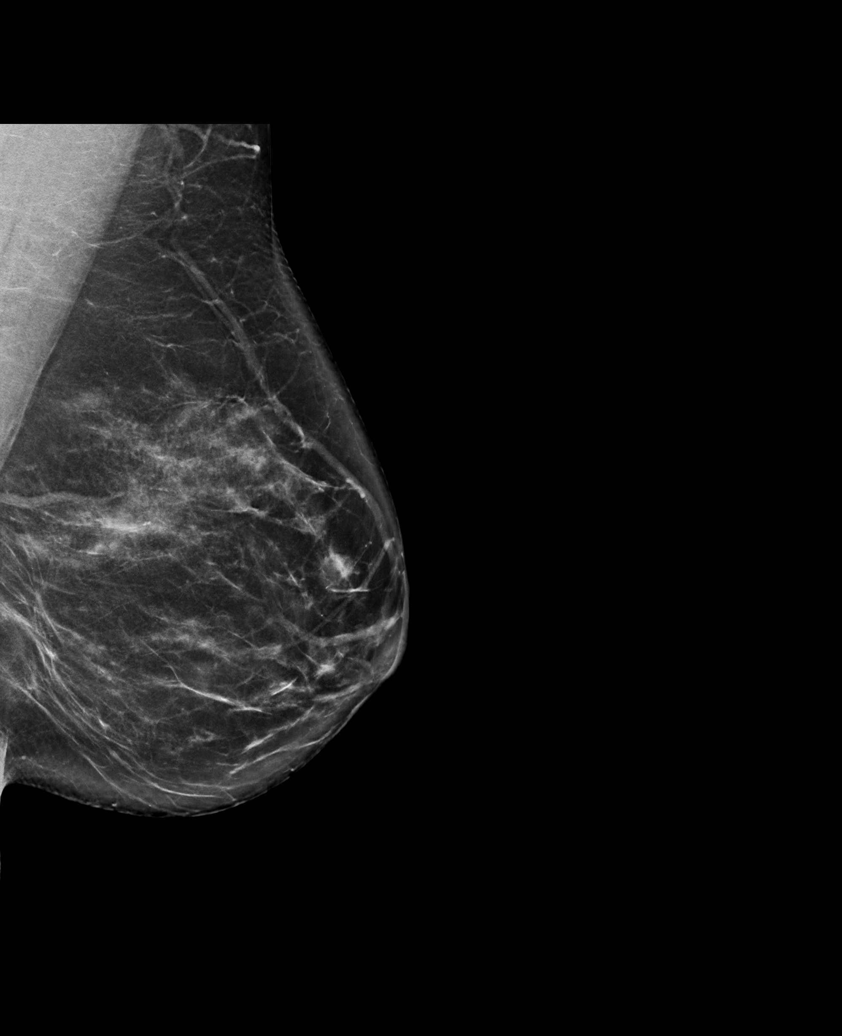

[R MLO synth-2D]
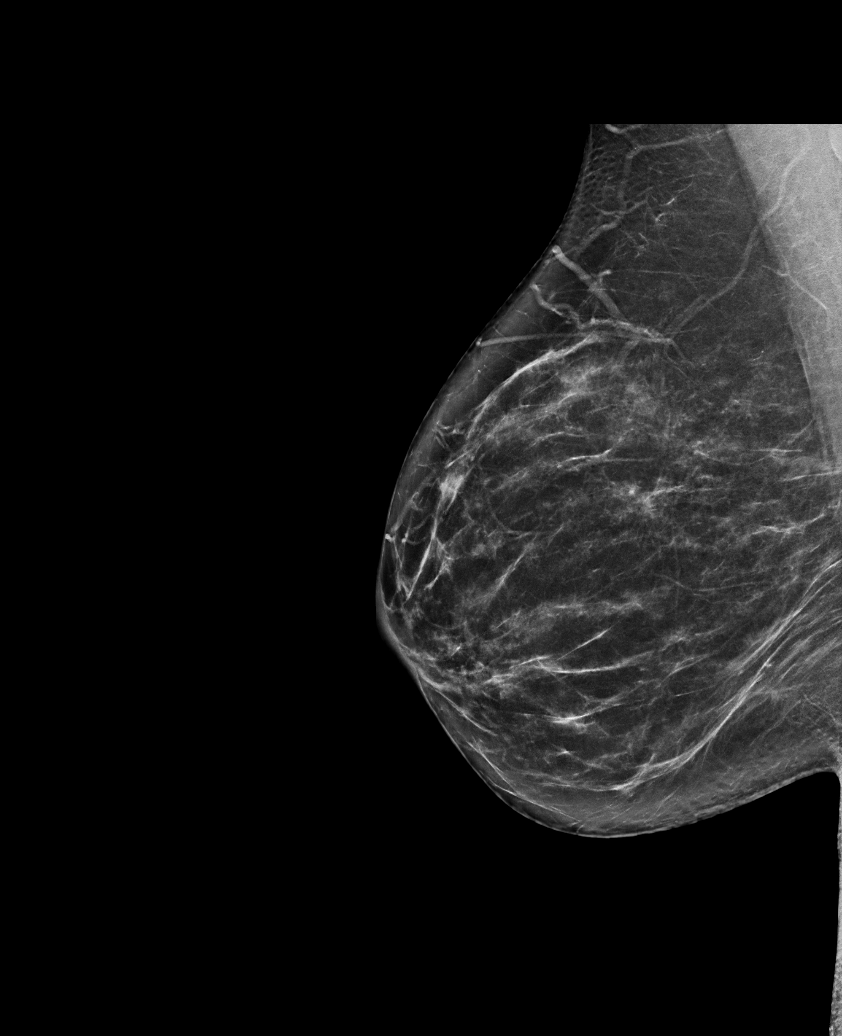

[L MLO synth-2D (2 of 2)]
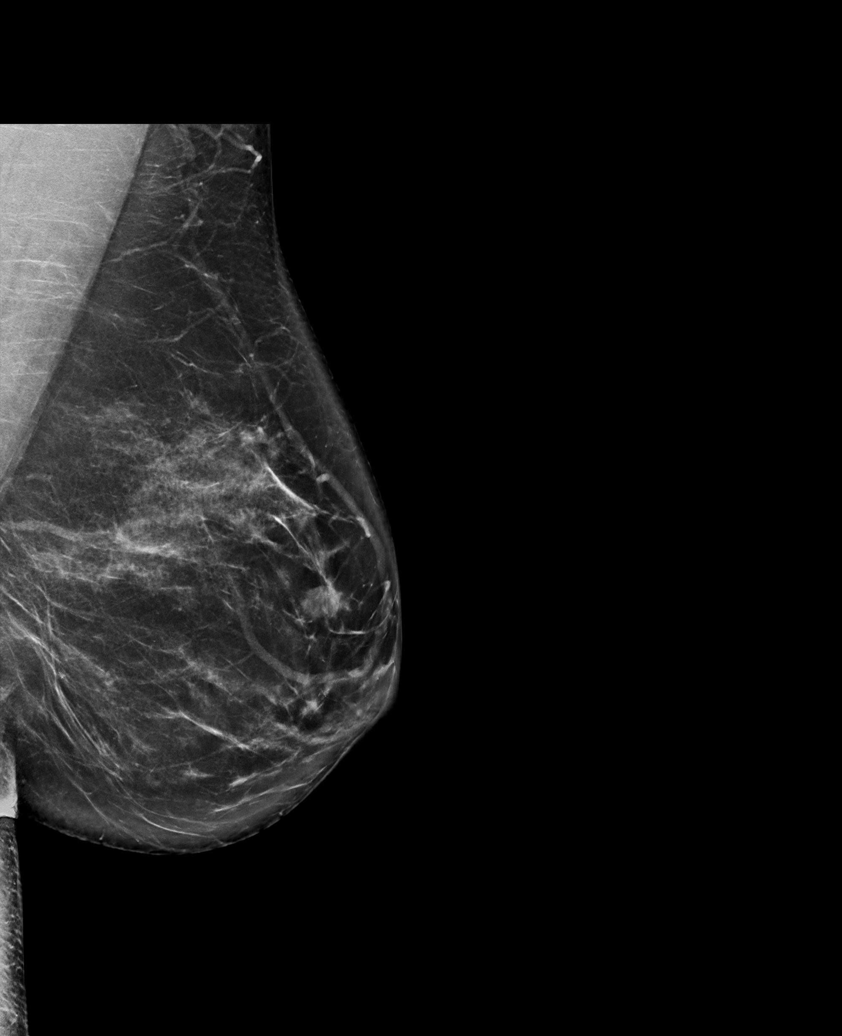

[L CC tomo · tomo slice 39/77.0]
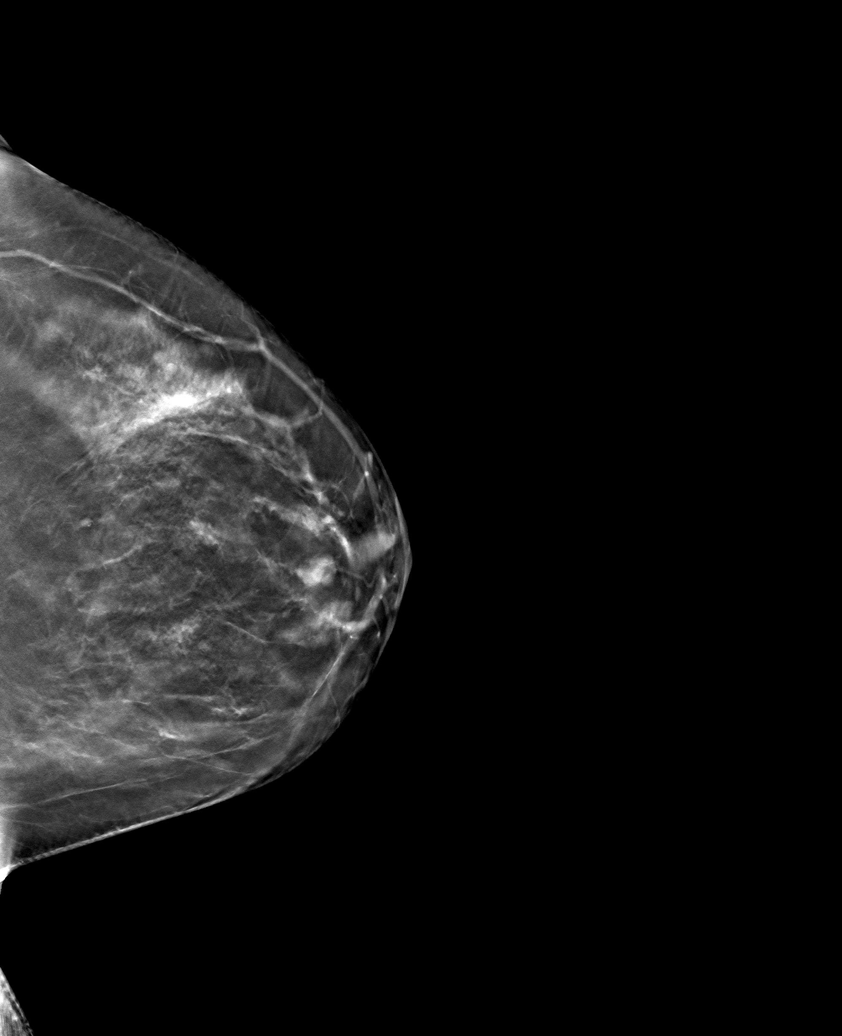

[6 of 30 positions shown; findings below may reference images not displayed]

ACR Breast Density Category b: There are scattered areas of
fibroglandular density.
FINDINGS: In the left breast, possible distortion and a separate possible mass
warrant further evaluation. In the right breast, no findings
suspicious for malignancy. Images were processed with CAD.
IMPRESSION: Further evaluation is suggested for possible distortion and a
separate possible mass in the left breast.

RECOMMENDATION:
Diagnostic mammogram and possibly ultrasound of the left breast.
(Code:LU-[id number])

The patient will be contacted regarding the findings, and additional
imaging will be scheduled.

BI-RADS CATEGORY  0: Incomplete. Need additional imaging evaluation
and/or prior mammograms for comparison.

## 2019-10-14 ENCOUNTER — Other Ambulatory Visit: Payer: Self-pay

## 2019-10-14 ENCOUNTER — Emergency Department: Payer: Medicare Other

## 2019-10-14 ENCOUNTER — Emergency Department
Admission: EM | Admit: 2019-10-14 | Discharge: 2019-10-14 | Disposition: A | Discharge status: Home / Self Care | Payer: Medicare Other | Attending: Student in an Organized Health Care Education/Training Program | Admitting: Student in an Organized Health Care Education/Training Program

## 2019-10-14 ENCOUNTER — Encounter: Payer: Self-pay | Admitting: Emergency Medicine

## 2019-10-14 DIAGNOSIS — R05 Cough: Secondary | ICD-10-CM | POA: Diagnosis present

## 2019-10-14 DIAGNOSIS — I10 Essential (primary) hypertension: Secondary | ICD-10-CM | POA: Diagnosis not present

## 2019-10-14 DIAGNOSIS — J449 Chronic obstructive pulmonary disease, unspecified: Secondary | ICD-10-CM | POA: Diagnosis not present

## 2019-10-14 DIAGNOSIS — Z20822 Contact with and (suspected) exposure to covid-19: Secondary | ICD-10-CM | POA: Diagnosis not present

## 2019-10-14 DIAGNOSIS — F17210 Nicotine dependence, cigarettes, uncomplicated: Secondary | ICD-10-CM | POA: Diagnosis not present

## 2019-10-14 DIAGNOSIS — J209 Acute bronchitis, unspecified: Secondary | ICD-10-CM | POA: Insufficient documentation

## 2019-10-14 DIAGNOSIS — Z79899 Other long term (current) drug therapy: Secondary | ICD-10-CM | POA: Diagnosis not present

## 2019-10-14 LAB — SARS CORONAVIRUS 2 BY RT PCR (HOSPITAL ORDER, PERFORMED IN ~~LOC~~ HOSPITAL LAB): SARS Coronavirus 2: NEGATIVE

## 2019-10-14 MED ORDER — PREDNISONE 10 MG PO TABS: 50.0000 mg | ORAL_TABLET | Freq: Every day | ORAL | 0 refills | Status: DC

## 2019-10-14 MED ORDER — AZITHROMYCIN 250 MG PO TABS: ORAL_TABLET | ORAL | 0 refills | Status: DC

## 2019-10-14 MED ORDER — HYDROCOD POLST-CPM POLST ER 10-8 MG/5ML PO SUER
5.0000 mL | Freq: Once | ORAL | Status: AC
  Administered 2019-10-14: 5 mL via ORAL
  Filled 2019-10-14: qty 5

## 2019-10-14 MED ORDER — GUAIFENESIN-CODEINE 100-10 MG/5ML PO SYRP: 5.0000 mL | ORAL_SOLUTION | Freq: Three times a day (TID) | ORAL | 0 refills | Status: DC | PRN

## 2019-10-14 MED ORDER — LEVOFLOXACIN 750 MG PO TABS
750.0000 mg | ORAL_TABLET | Freq: Every day | ORAL | 0 refills | Status: AC
Start: 2019-10-14 — End: 2019-10-21

## 2019-10-14 NOTE — ED Notes (Signed)
See triage note  Presents with cough and congestion  States sx's started on Sunday  Fever at home of 100.4  Low grade on arrival    Min relief with inhalers and SVN treatments

## 2019-10-14 NOTE — ED Triage Notes (Signed)
Pt to ED with c/o of cough that has been ongoing since Sunday. Pt states productive cough. Pt has hx of COPD. Pt also states fever that she has been taking Ibuprofen for. Pt's temp in triage 99.7. Pt reports taking Ibuprofen at approx 2 pm.

## 2019-10-14 NOTE — Discharge Instructions (Signed)
COVID test is pending and should result this evening. If positive, quarantine for the next 10 days.  Follow up with your primary care provider later this week or early next week if possible.  Return to the ER for symptoms that change or worsen if unable to schedule an appointment.

## 2019-10-14 NOTE — ED Provider Notes (Addendum)
[facility] Emergency Department Provider Note  ____________________________________________  Time seen: Approximately 5:07 PM  I have reviewed the triage vital signs and the nursing notes.   HISTORY  Chief Complaint Cough   HPI Karen Russell is a 44 y.o. female presenting to the emergency department for treatment and evaluation of cough, sinus congestion, earache, and fever.   Symptoms started 2 days ago and have progressively worsened.  She has a longstanding history of COPD, smokes cigarettes, and is on daily medications.  She is also using her rescue inhaler more frequently than she is supposed to.    Past Medical History:  Diagnosis Date  . Arthritis   . COPD (chronic obstructive pulmonary disease) ([place])   . Fibromyalgia syndrome   . Hyperlipemia   . Hypertension     Patient Active Problem List   Diagnosis Date Noted  . Acute bronchitis 09/30/2017  . HTN (hypertension) 09/30/2017  . Respiratory distress, acute 09/30/2017  . COPD exacerbation ([place]) 05/25/2017  . Respiratory failure with hypoxia ([place]) 08/10/2016    Past Surgical History:  Procedure Laterality Date  . BREAST BIOPSY Left 09/05/2018   stereo biopsy/ x clip/ path pending  . BREAST BIOPSY Left 09/05/2018   US biopsy/ path pending  . KIDNEY SURGERY    . TUBAL LIGATION      Prior to Admission medications   Medication Sig Start Date End Date Taking? Authorizing Provider  atenolol (TENORMIN) 25 MG tablet Take 1 tablet (25 mg total) by mouth daily. 10/02/17   Bettey Costa, MD  atorvastatin (LIPITOR) 40 MG tablet Take 40 mg by mouth at bedtime.    [provider]  gabapentin (NEURONTIN) 400 MG capsule Take 1 capsule (400 mg total) by mouth 3 (three) times daily. 10/02/17   Bettey Costa, MD  guaiFENesin-codeine (ROBITUSSIN AC) 100-10 MG/5ML syrup Take 5 mLs by mouth 3 (three) times daily as needed for cough. 10/14/19   Penda Venturi B, FNP  ipratropium-albuterol (DUONEB) 0.5-2.5  (3) MG/3ML SOLN Take 3 mLs by nebulization every 6 (six) hours as needed. 06/01/18   Nance Pear, MD  levofloxacin (LEVAQUIN) 750 MG tablet Take 1 tablet (750 mg total) by mouth daily for 7 days. 10/14/19 10/21/19  Rin Gorton, Johnette Abraham B, FNP  mometasone-formoterol (DULERA) 200-5 MCG/ACT AERO Inhale 2 puffs into the lungs 2 (two) times daily. 10/02/17 06/01/18  Bettey Costa, MD  omeprazole (PRILOSEC) 20 MG capsule Take 20 mg by mouth daily.    [provider]  predniSONE (DELTASONE) 10 MG tablet Take 5 tablets (50 mg total) by mouth daily. 10/14/19   Victorino Dike, FNP    Allergies Patient has no known allergies.  Family History  Problem Relation Age of Onset  . Breast cancer Paternal Aunt   . Breast cancer Cousin     Social History Social History   Tobacco Use  . Smoking status: Current Every Day Smoker    Packs/day: 0.50    Types: Cigarettes  . Smokeless tobacco: Never Used  Substance Use Topics  . Alcohol use: No  . Drug use: No    Review of Systems Constitutional: Positive for fever/chills. Decreased appetite. ENT: No sore throat. Positive for left earache. Positive for sinus congestion. Cardiovascular: Denies chest pain. Respiratory: Positive for shortness of breath. Positive for cough. Positive for wheezing.  Gastrointestinal: Negative for nausea,  no vomiting.  no diarrhea.  Musculoskeletal: Negative for body aches Skin: Negative for rash. Neurological: Negative for headaches ____________________________________________   PHYSICAL EXAM:  VITAL SIGNS: ED Triage Vitals  Enc Vitals Group     BP 10/14/19 1636 (!) 156/86     Pulse Rate 10/14/19 1636 (!) 106     Resp --      Temp 10/14/19 1636 99.7 F (37.6 C)     Temp Source 10/14/19 1636 Oral     SpO2 10/14/19 1636 93 %     Weight 10/14/19 1637 170 lb (77.1 kg)     Height 10/14/19 1637 5\' 2"  (1.575 m)     Head Circumference --      Peak Flow --      Pain Score 10/14/19 1637 10     Pain Loc --       Pain Edu? --      Excl. in GC? --     Constitutional: Alert and oriented. Overall well appearing and in no acute distress. Eyes: Conjunctivae are normal. Ears: Left TM erythematous, dull. Right TM pearly Nose: Maxillary sinus congestion noted; no rhinnorhea. Mouth/Throat: Mucous membranes are moist.  Oropharynx clear. Tonsils flat. Uvula midline. Neck: No stridor.  Lymphatic: No cervical lymphadenopathy. Cardiovascular: Normal rate, regular rhythm. Good peripheral circulation. Respiratory: Respirations are even and unlabored.  No retractions. Rhonchi throughout. Gastrointestinal: Soft and nontender.  Musculoskeletal: FROM x 4 extremities.  Neurologic:  Normal speech and language. Skin:  Skin is warm, dry and intact. No rash noted. Psychiatric: Mood and affect are normal. Speech and behavior are normal.  ____________________________________________   LABS (all labs ordered are listed, but only abnormal results are displayed)  Labs Reviewed  SARS CORONAVIRUS 2 BY RT PCR (HOSPITAL ORDER, PERFORMED IN [facility])   ____________________________________________  EKG  Not indicated. ____________________________________________  RADIOLOGY  Chest x-ray negative for acute cardiopulmonary abnormality. ____________________________________________   PROCEDURES  Procedure(s) performed: None  Critical Care performed: No ____________________________________________   INITIAL IMPRESSION / ASSESSMENT AND PLAN / ED COURSE  44 y.o. female sending to the emergency department for treatment and evaluation of symptoms as described in the HPI.  Plan will be to get a chest x-ray, give her a DuoNeb, and some Tussionex.  Chest x-ray is negative for acute cardiopulmonary findings.  She will be treated with prednisone for the next 5 days as well as azithromycin which should cover for otitis media as well as help prevent developing pneumonia or atypical pneumonia not visualized on  imaging.   A refill of her albuterol inhaler also be written.  She is to call and schedule follow-up appointment with her primary care provider later this week.  She is to return to the emergency department for symptoms of concern if she is unable to schedule appointment.  Patient now states that Azithromycin has never worked well for bronchitis/COPD flare. Contacted pharmacy to cancel azithromycin and started Levaquin.   Medications  chlorpheniramine-HYDROcodone (TUSSIONEX) 10-8 MG/5ML suspension 5 mL (5 mLs Oral Given 10/14/19 1741)    ED Discharge Orders         Ordered    predniSONE (DELTASONE) 10 MG tablet  Daily     10/14/19 1814    azithromycin (ZITHROMAX) 250 MG tablet  Status:  Discontinued     10/14/19 1814    guaiFENesin-codeine (ROBITUSSIN AC) 100-10 MG/5ML syrup  3 times daily PRN     10/14/19 1814    levofloxacin (LEVAQUIN) 750 MG tablet  Daily     10/14/19 1818           Pertinent labs & imaging results that were available during my  care of the patient were reviewed by me and considered in my medical decision making (see chart for details).    If controlled substance prescribed during this visit, 12 month history viewed on the [facility] prior to issuing an initial prescription for Schedule II or III opiod. ____________________________________________   FINAL CLINICAL IMPRESSION(S) / ED DIAGNOSES  Final diagnoses:  Acute bronchitis, unspecified organism    Note:  This document was prepared using Dragon voice recognition software and may include unintentional dictation errors.    Chinita Pester, FNP 10/14/19 1840    Chinita Pester, FNP 10/14/19 1842    Willy Eddy, MD 10/14/19 1947

## 2021-11-04 DIAGNOSIS — J40 Bronchitis, not specified as acute or chronic: Secondary | ICD-10-CM | POA: Diagnosis not present

## 2021-11-04 DIAGNOSIS — R051 Acute cough: Secondary | ICD-10-CM | POA: Diagnosis not present

## 2022-01-28 DIAGNOSIS — Z03818 Encounter for observation for suspected exposure to other biological agents ruled out: Secondary | ICD-10-CM | POA: Diagnosis not present

## 2022-01-28 DIAGNOSIS — J449 Chronic obstructive pulmonary disease, unspecified: Secondary | ICD-10-CM | POA: Diagnosis not present

## 2022-01-28 DIAGNOSIS — J40 Bronchitis, not specified as acute or chronic: Secondary | ICD-10-CM | POA: Diagnosis not present

## 2022-07-13 DIAGNOSIS — J449 Chronic obstructive pulmonary disease, unspecified: Secondary | ICD-10-CM | POA: Diagnosis not present

## 2022-07-13 DIAGNOSIS — J40 Bronchitis, not specified as acute or chronic: Secondary | ICD-10-CM | POA: Diagnosis not present

## 2022-07-13 DIAGNOSIS — Z03818 Encounter for observation for suspected exposure to other biological agents ruled out: Secondary | ICD-10-CM | POA: Diagnosis not present

## 2023-02-27 DIAGNOSIS — K047 Periapical abscess without sinus: Secondary | ICD-10-CM | POA: Diagnosis not present

## 2023-02-27 DIAGNOSIS — J449 Chronic obstructive pulmonary disease, unspecified: Secondary | ICD-10-CM | POA: Diagnosis not present

## 2023-03-05 ENCOUNTER — Encounter: Payer: Self-pay | Admitting: Family

## 2023-03-05 ENCOUNTER — Ambulatory Visit
Admission: RE | Admit: 2023-03-05 | Discharge: 2023-03-05 | Disposition: A | Discharge status: Home / Self Care | Payer: Medicare HMO | Attending: Family | Admitting: Family

## 2023-03-05 ENCOUNTER — Ambulatory Visit (INDEPENDENT_AMBULATORY_CARE_PROVIDER_SITE_OTHER): Payer: Medicare HMO | Admitting: Family

## 2023-03-05 ENCOUNTER — Ambulatory Visit
Admission: RE | Admit: 2023-03-05 | Discharge: 2023-03-05 | Disposition: A | Discharge status: Home / Self Care | Payer: Medicare HMO | Source: Ambulatory Visit | Attending: Family | Admitting: Family

## 2023-03-05 VITALS — BP 138/78 | HR 94 | Ht 62.0 in | Wt 173.6 lb

## 2023-03-05 DIAGNOSIS — R7303 Prediabetes: Secondary | ICD-10-CM | POA: Diagnosis not present

## 2023-03-05 DIAGNOSIS — I10 Essential (primary) hypertension: Secondary | ICD-10-CM

## 2023-03-05 DIAGNOSIS — E782 Mixed hyperlipidemia: Secondary | ICD-10-CM

## 2023-03-05 DIAGNOSIS — Z23 Encounter for immunization: Secondary | ICD-10-CM

## 2023-03-05 DIAGNOSIS — G8929 Other chronic pain: Secondary | ICD-10-CM

## 2023-03-05 DIAGNOSIS — R5383 Other fatigue: Secondary | ICD-10-CM | POA: Diagnosis not present

## 2023-03-05 DIAGNOSIS — E559 Vitamin D deficiency, unspecified: Secondary | ICD-10-CM | POA: Diagnosis not present

## 2023-03-05 DIAGNOSIS — M79672 Pain in left foot: Secondary | ICD-10-CM | POA: Insufficient documentation

## 2023-03-05 DIAGNOSIS — M7732 Calcaneal spur, left foot: Secondary | ICD-10-CM | POA: Diagnosis not present

## 2023-03-05 DIAGNOSIS — M797 Fibromyalgia: Secondary | ICD-10-CM | POA: Insufficient documentation

## 2023-03-05 DIAGNOSIS — E538 Deficiency of other specified B group vitamins: Secondary | ICD-10-CM | POA: Diagnosis not present

## 2023-03-05 NOTE — Progress Notes (Signed)
New Patient Office Visit  Subjective    Patient ID: Karen Russell, female    DOB: [birth year]  Age: 47 y.o. MRN: [id number]  CC: No chief complaint on file.   HPI Karen Russell presents to establish care Previous Primary Care provider/office:   she does have additional concerns to discuss today.   Patient here to establish care.  She has not had a PCP recently, and she asks if we can send refills.  She also asks if we can get her set up with a podiatrist as she is having issues with pain in her left heel.  She says this has been happening for around a year.   Needs labs.   No other concerns today.    Outpatient Encounter Medications as of 03/05/2023  Medication Sig   albuterol (VENTOLIN HFA) 108 (90 Base) MCG/ACT inhaler 1-2 puffs every 6 (six) hours as needed for wheezing or shortness of breath.   atenolol (TENORMIN) 25 MG tablet Take 1 tablet (25 mg total) by mouth daily.   BREZTRI AEROSPHERE 160-9-4.8 MCG/ACT AERO Inhale 2 puffs into the lungs in the morning and at bedtime.   fluticasone (FLONASE) 50 MCG/ACT nasal spray Place 1 spray into both nostrils daily.   gabapentin (NEURONTIN) 400 MG capsule Take 1 capsule (400 mg total) by mouth 3 (three) times daily.   ipratropium-albuterol (DUONEB) 0.5-2.5 (3) MG/3ML SOLN Take 3 mLs by nebulization every 6 (six) hours as needed.   omeprazole (PRILOSEC) 40 MG capsule Take 40 mg by mouth daily.   rosuvastatin (CRESTOR) 40 MG tablet Take 40 mg by mouth daily.   tiZANidine (ZANAFLEX) 4 MG tablet 4 mg every 6 (six) hours as needed for muscle spasms.   traMADol (ULTRAM) 50 MG tablet Take 50 mg by mouth 3 (three) times daily as needed.   [DISCONTINUED] amoxicillin-clavulanate (AUGMENTIN) 875-125 MG tablet Take 1 tablet by mouth 2 (two) times daily.   [DISCONTINUED] atorvastatin (LIPITOR) 40 MG tablet Take 40 mg by mouth at bedtime.   [DISCONTINUED] omeprazole (PRILOSEC) 20 MG capsule Take 20 mg by mouth daily.   [DISCONTINUED]  predniSONE (DELTASONE) 10 MG tablet Take 5 tablets (50 mg total) by mouth daily.   montelukast (SINGULAIR) 10 MG tablet Take 10 mg by mouth at bedtime.   rOPINIRole (REQUIP) 0.5 MG tablet Take 0.5 mg by mouth at bedtime.   [DISCONTINUED] guaiFENesin-codeine (ROBITUSSIN AC) 100-10 MG/5ML syrup Take 5 mLs by mouth 3 (three) times daily as needed for cough.   [DISCONTINUED] mometasone-formoterol (DULERA) 200-5 MCG/ACT AERO Inhale 2 puffs into the lungs 2 (two) times daily.   No facility-administered encounter medications on file as of 03/05/2023.    Past Medical History:  Diagnosis Date   Acute bronchitis 09/30/2017   Arthritis    COPD (chronic obstructive pulmonary disease) (HCC)    Fibromyalgia syndrome    Hyperlipemia    Hypertension    Respiratory distress, acute 09/30/2017   Respiratory failure with hypoxia (HCC) 08/10/2016    Past Surgical History:  Procedure Laterality Date   BREAST BIOPSY Left 09/05/2018   stereo biopsy/ x clip/ path pending   BREAST BIOPSY Left 09/05/2018   US biopsy/ path pending   KIDNEY SURGERY     TUBAL LIGATION      Family History  Problem Relation Age of Onset   Breast cancer Paternal Aunt    Breast cancer Cousin     Social History   Socioeconomic History   Marital status: Single    Spouse  name: Not on file   Number of children: Not on file   Years of education: Not on file   Highest education level: Not on file  Occupational History   Not on file  Tobacco Use   Smoking status: Every Day    Current packs/day: 0.50    Types: Cigarettes   Smokeless tobacco: Never  Vaping Use   Vaping status: Never Used  Substance and Sexual Activity   Alcohol use: No   Drug use: No   Sexual activity: Yes  Other Topics Concern   Not on file  Social History Narrative   Not on file   Social Determinants of Health   Financial Resource Strain: Unknown (09/30/2017)   Overall Financial Resource Strain (CARDIA)    Difficulty of Paying Living  Expenses: Patient declined  Food Insecurity: Unknown (09/30/2017)   Hunger Vital Sign    Worried About Running Out of Food in the Last Year: Patient declined    Ran Out of Food in the Last Year: Patient declined  Transportation Needs: Unknown (09/30/2017)   PRAPARE - Transportation    Lack of Transportation (Medical): Patient declined    Lack of Transportation (Non-Medical): Patient declined  Physical Activity: Unknown (09/30/2017)   Exercise Vital Sign    Days of Exercise per Week: Patient declined    Minutes of Exercise per Session: Patient declined  Stress: Unknown (09/30/2017)   [employer] of Occupational Health - Occupational Stress Questionnaire    Feeling of Stress : Patient declined  Social Connections: Unknown (09/30/2017)   Social Connection and Isolation Panel [NHANES]    Frequency of Communication with Friends and Family: Patient declined    Frequency of Social Gatherings with Friends and Family: Patient declined    Attends Religious Services: Patient declined    Database administrator or Organizations: Patient declined    Attends Banker Meetings: Patient declined    Marital Status: Patient declined  Intimate Partner Violence: Unknown (09/30/2017)   Humiliation, Afraid, Rape, and Kick questionnaire    Fear of Current or Ex-Partner: Patient declined    Emotionally Abused: Patient declined    Physically Abused: Patient declined    Sexually Abused: Patient declined    Review of Systems  Musculoskeletal:  Positive for joint pain.  All other systems reviewed and are negative.       Objective    BP 138/78   Pulse 94   Ht 5\' 2"  (1.575 m)   Wt 173 lb 9.6 oz (78.7 kg)   LMP 02/19/2023   SpO2 99%   BMI 31.75 kg/m   Physical Exam Vitals and nursing note reviewed.  Constitutional:      Appearance: Normal appearance. She is normal weight.  HENT:     Head: Normocephalic.  Eyes:     Pupils: Pupils are equal, round, and reactive to light.   Cardiovascular:     Rate and Rhythm: Normal rate.  Pulmonary:     Effort: Pulmonary effort is normal.  Musculoskeletal:       Feet:  Feet:     Right foot:     Skin integrity: Skin integrity normal.     Left foot:     Skin integrity: Skin integrity normal.  Neurological:     General: No focal deficit present.     Mental Status: She is alert and oriented to person, place, and time. Mental status is at baseline.  Psychiatric:        Mood and Affect: Mood normal.  Behavior: Behavior normal.        Thought Content: Thought content normal.        Judgment: Judgment normal.        Assessment & Plan:   Problem List Items Addressed This Visit       Active Problems   HTN (hypertension)   Relevant Medications   rosuvastatin (CRESTOR) 40 MG tablet   Other Relevant Orders   CMP14+EGFR   CBC with Differential/Platelet   Vitamin D deficiency   Relevant Orders   VITAMIN D 25 Hydroxy (Vit-D Deficiency, Fractures)   CMP14+EGFR   CBC with Differential/Platelet   Fibromyalgia syndrome   Relevant Medications   tiZANidine (ZANAFLEX) 4 MG tablet   traMADol (ULTRAM) 50 MG tablet   Other Relevant Orders   CMP14+EGFR   CBC with Differential/Platelet   Other Visit Diagnoses     Chronic heel pain, left    -  Primary   Relevant Medications   tiZANidine (ZANAFLEX) 4 MG tablet   traMADol (ULTRAM) 50 MG tablet   Other Relevant Orders   DG Foot Complete Left   CMP14+EGFR   CBC with Differential/Platelet   Flu vaccine need       Relevant Orders   Influenza, MDCK, trivalent, PF(Flucelvax egg-free) (Completed)   CMP14+EGFR   CBC with Differential/Platelet   Mixed hyperlipidemia       Relevant Medications   rosuvastatin (CRESTOR) 40 MG tablet   Other Relevant Orders   Lipid panel   CMP14+EGFR   CBC with Differential/Platelet   Prediabetes       Relevant Orders   CMP14+EGFR   Hemoglobin A1c   CBC with Differential/Platelet   B12 deficiency due to diet       Relevant  Orders   CMP14+EGFR   Vitamin B12   CBC with Differential/Platelet   Other fatigue       Relevant Orders   CMP14+EGFR   TSH   CBC with Differential/Platelet      Labs today - will discuss results in detail at follow up.   Return in about 2 weeks (around 03/19/2023).   Total time spent: 30 minutes  Miki Kins, FNP  03/05/2023  This document may have been prepared by [facility] Voice Recognition software and as such may include unintentional dictation errors.

## 2023-03-06 LAB — LIPID PANEL
Chol/HDL Ratio: 4.9 {ratio} — ABNORMAL HIGH (ref 0.0–4.4)
Cholesterol, Total: 233 mg/dL — ABNORMAL HIGH (ref 100–199)
HDL: 48 mg/dL (ref >39)
LDL Chol Calc (NIH): 143 mg/dL — ABNORMAL HIGH (ref 0–99)
Triglycerides: 234 mg/dL — ABNORMAL HIGH (ref 0–149)
VLDL Cholesterol Cal: 42 mg/dL — ABNORMAL HIGH (ref 5–40)

## 2023-03-06 LAB — CMP14+EGFR
ALT: 14 [IU]/L (ref 0–32)
AST: 20 [IU]/L (ref 0–40)
Albumin: 4.2 g/dL (ref 3.9–4.9)
Alkaline Phosphatase: 81 [IU]/L (ref 44–121)
BUN/Creatinine Ratio: 13 (ref 9–23)
BUN: 10 mg/dL (ref 6–24)
Bilirubin Total: 0.2 mg/dL (ref 0.0–1.2)
CO2: 22 mmol/L (ref 20–29)
Calcium: 9.6 mg/dL (ref 8.7–10.2)
Chloride: 102 mmol/L (ref 96–106)
Creatinine, Ser: 0.8 mg/dL (ref 0.57–1.00)
Globulin, Total: 2.4 g/dL (ref 1.5–4.5)
Glucose: 74 mg/dL (ref 70–99)
Potassium: 4.8 mmol/L (ref 3.5–5.2)
Sodium: 137 mmol/L (ref 134–144)
Total Protein: 6.6 g/dL (ref 6.0–8.5)
eGFR: 91 mL/min/{1.73_m2} (ref >59)

## 2023-03-06 LAB — TSH: TSH: 2.18 u[IU]/mL (ref 0.450–4.500)

## 2023-03-06 LAB — VITAMIN B12: Vitamin B-12: 609 pg/mL (ref 232–1245)

## 2023-03-06 LAB — VITAMIN D 25 HYDROXY (VIT D DEFICIENCY, FRACTURES): Vit D, 25-Hydroxy: 35.7 ng/mL (ref 30.0–100.0)

## 2023-03-06 LAB — HEMOGLOBIN A1C
Est. average glucose Bld gHb Est-mCnc: 111 mg/dL
Hgb A1c MFr Bld: 5.5 % (ref 4.8–5.6)

## 2023-03-19 ENCOUNTER — Ambulatory Visit: Payer: Medicare HMO | Admitting: Family

## 2023-03-20 ENCOUNTER — Ambulatory Visit (INDEPENDENT_AMBULATORY_CARE_PROVIDER_SITE_OTHER): Payer: Medicare HMO | Admitting: Family

## 2023-03-20 ENCOUNTER — Encounter: Payer: Self-pay | Admitting: Family

## 2023-03-20 VITALS — BP 140/88 | HR 102 | Ht 62.0 in | Wt 168.6 lb

## 2023-03-20 DIAGNOSIS — E559 Vitamin D deficiency, unspecified: Secondary | ICD-10-CM | POA: Diagnosis not present

## 2023-03-20 DIAGNOSIS — G8929 Other chronic pain: Secondary | ICD-10-CM | POA: Diagnosis not present

## 2023-03-20 DIAGNOSIS — J069 Acute upper respiratory infection, unspecified: Secondary | ICD-10-CM | POA: Diagnosis not present

## 2023-03-20 DIAGNOSIS — I10 Essential (primary) hypertension: Secondary | ICD-10-CM

## 2023-03-20 DIAGNOSIS — M797 Fibromyalgia: Secondary | ICD-10-CM | POA: Diagnosis not present

## 2023-03-20 DIAGNOSIS — E782 Mixed hyperlipidemia: Secondary | ICD-10-CM

## 2023-03-20 DIAGNOSIS — M79672 Pain in left foot: Secondary | ICD-10-CM | POA: Diagnosis not present

## 2023-03-20 DIAGNOSIS — R7303 Prediabetes: Secondary | ICD-10-CM

## 2023-03-20 LAB — POCT XPERT XPRESS SARS COVID-2/FLU/RSV
FLU A: NEGATIVE
FLU B: NEGATIVE
RSV RNA, PCR: NEGATIVE
SARS Coronavirus 2: NEGATIVE

## 2023-03-20 MED ORDER — AZITHROMYCIN 250 MG PO TABS: ORAL_TABLET | ORAL | 0 refills | Status: AC

## 2023-03-20 NOTE — Progress Notes (Unsigned)
Established Patient Office Visit  Subjective:  Patient ID: Karen Russell, female    DOB: [birth year]  Age: 47 y.o. MRN: [id number]  Chief Complaint  Patient presents with   Follow-up    2 week follow up    Pt. Here today for 2 week n/p follow up.   Had labs done at that time, so we will review in detail today.   Labs: Cholesterol was elevated, but otherwise, labs looked great.  The cholesterol was improved from her last draw, says that she has been losing weight.   She does have a URI that she got from her grandchild.  They did have a COVID test run on her, and it was negative, but the patient has not had one done at this point.  Having cough, sinus congestion, runny nose, and fatigue.   No other concerns at this time.   Past Medical History:  Diagnosis Date   Acute bronchitis 09/30/2017   Arthritis    COPD (chronic obstructive pulmonary disease) (HCC)    Fibromyalgia syndrome    Hyperlipemia    Hypertension    Respiratory distress, acute 09/30/2017   Respiratory failure with hypoxia (HCC) 08/10/2016    Past Surgical History:  Procedure Laterality Date   BREAST BIOPSY Left 09/05/2018   stereo biopsy/ x clip/ path pending   BREAST BIOPSY Left 09/05/2018   US biopsy/ path pending   KIDNEY SURGERY     TUBAL LIGATION      Social History   Socioeconomic History   Marital status: Single    Spouse name: Not on file   Number of children: Not on file   Years of education: Not on file   Highest education level: Not on file  Occupational History   Not on file  Tobacco Use   Smoking status: Every Day    Current packs/day: 0.50    Types: Cigarettes   Smokeless tobacco: Never  Vaping Use   Vaping status: Never Used  Substance and Sexual Activity   Alcohol use: No   Drug use: No   Sexual activity: Yes  Other Topics Concern   Not on file  Social History Narrative   Not on file   Social Determinants of Health   Financial Resource Strain: Unknown (09/30/2017)    Overall Financial Resource Strain (CARDIA)    Difficulty of Paying Living Expenses: Patient declined  Food Insecurity: Unknown (09/30/2017)   Hunger Vital Sign    Worried About Running Out of Food in the Last Year: Patient declined    Barista in the Last Year: Patient declined  Transportation Needs: Unknown (09/30/2017)   PRAPARE - Administrator, Civil Service (Medical): Patient declined    Lack of Transportation (Non-Medical): Patient declined  Physical Activity: Unknown (09/30/2017)   Exercise Vital Sign    Days of Exercise per Week: Patient declined    Minutes of Exercise per Session: Patient declined  Stress: Unknown (09/30/2017)   [employer] of Occupational Health - Occupational Stress Questionnaire    Feeling of Stress : Patient declined  Social Connections: Unknown (09/30/2017)   Social Connection and Isolation Panel [NHANES]    Frequency of Communication with Friends and Family: Patient declined    Frequency of Social Gatherings with Friends and Family: Patient declined    Attends Religious Services: Patient declined    Active Member of Clubs or Organizations: Patient declined    Attends Banker Meetings: Patient declined    Marital  Status: Patient declined  Intimate Partner Violence: Unknown (09/30/2017)   Humiliation, Afraid, Rape, and Kick questionnaire    Fear of Current or Ex-Partner: Patient declined    Emotionally Abused: Patient declined    Physically Abused: Patient declined    Sexually Abused: Patient declined    Family History  Problem Relation Age of Onset   Breast cancer Paternal Aunt    Breast cancer Cousin     Allergies  Allergen Reactions   Augmentin [Amoxicillin-Pot Clavulanate] Nausea And Vomiting    Review of Systems  HENT:  Positive for congestion and sinus pain.   Respiratory:  Positive for cough.   All other systems reviewed and are negative.      Objective:   BP (!) 140/88   Pulse (!) 102   Ht  5\' 2"  (1.575 m)   Wt 168 lb 9.6 oz (76.5 kg)   LMP 02/19/2023   SpO2 97%   BMI 30.84 kg/m   Vitals:   03/20/23 1029  BP: (!) 140/88  Pulse: (!) 102  Height: 5\' 2"  (1.575 m)  Weight: 168 lb 9.6 oz (76.5 kg)  SpO2: 97%  BMI (Calculated): 30.83    Physical Exam Vitals and nursing note reviewed.  Constitutional:      Appearance: Normal appearance. She is normal weight.  HENT:     Head: Normocephalic.     Nose: Congestion and rhinorrhea present.     Right Turbinates: Enlarged.     Left Turbinates: Enlarged.  Eyes:     Conjunctiva/sclera: Conjunctivae normal.     Pupils: Pupils are equal, round, and reactive to light.  Cardiovascular:     Rate and Rhythm: Normal rate.  Pulmonary:     Effort: Pulmonary effort is normal.  Neurological:     General: No focal deficit present.     Mental Status: She is alert and oriented to person, place, and time. Mental status is at baseline.  Psychiatric:        Mood and Affect: Mood normal.        Behavior: Behavior normal.        Thought Content: Thought content normal.        Judgment: Judgment normal.      Results for orders placed or performed in visit on 03/20/23  POCT XPERT XPRESS SARS COVID-2/FLU/RSV  Result Value Ref Range   SARS Coronavirus 2 Negative    FLU A Negative    FLU B Negative    RSV RNA, PCR Negative     Recent Results (from the past 2160 hour(s))  Lipid panel     Status: Abnormal   Collection Time: 03/05/23 10:23 AM  Result Value Ref Range   Cholesterol, Total 233 (H) 100 - 199 mg/dL   Triglycerides 161 (H) 0 - 149 mg/dL   HDL 48 >09 mg/dL   VLDL Cholesterol Cal 42 (H) 5 - 40 mg/dL   LDL Chol Calc (NIH) 604 (H) 0 - 99 mg/dL   Chol/HDL Ratio 4.9 (H) 0.0 - 4.4 ratio    Comment:                                   T. Chol/HDL Ratio  Men  Women                               1/2 Avg.Risk  3.4    3.3                                   Avg.Risk  5.0    4.4                                 2X Avg.Risk  9.6    7.1                                3X Avg.Risk 23.4   11.0   VITAMIN D 25 Hydroxy (Vit-D Deficiency, Fractures)     Status: None   Collection Time: 03/05/23 10:23 AM  Result Value Ref Range   Vit D, 25-Hydroxy 35.7 30.0 - 100.0 ng/mL    Comment: Vitamin D deficiency has been defined by the Institute of Medicine and an Endocrine Society practice guideline as a level of serum 25-OH vitamin D less than 20 ng/mL (1,2). The Endocrine Society went on to further define vitamin D insufficiency as a level between 21 and 29 ng/mL (2). 1. IOM (Institute of Medicine). 2010. Dietary reference    intakes for calcium and D. [place]: The    [employer]. 2. Holick MF, Binkley [place], Bischoff-Ferrari HA, et al.    Evaluation, treatment, and prevention of vitamin D    deficiency: an Endocrine Society clinical practice    guideline. JCEM. 2011 Jul; 96(7):1911-30.   CMP14+EGFR     Status: None   Collection Time: 03/05/23 10:23 AM  Result Value Ref Range   Glucose 74 70 - 99 mg/dL   BUN 10 6 - 24 mg/dL   Creatinine, Ser 9.60 0.57 - 1.00 mg/dL   eGFR 91 >45 WU/JWJ/1.91   BUN/Creatinine Ratio 13 9 - 23   Sodium 137 134 - 144 mmol/L   Potassium 4.8 3.5 - 5.2 mmol/L   Chloride 102 96 - 106 mmol/L   CO2 22 20 - 29 mmol/L   Calcium 9.6 8.7 - 10.2 mg/dL   Total Protein 6.6 6.0 - 8.5 g/dL   Albumin 4.2 3.9 - 4.9 g/dL   Globulin, Total 2.4 1.5 - 4.5 g/dL   Bilirubin Total 0.2 0.0 - 1.2 mg/dL   Alkaline Phosphatase 81 44 - 121 IU/L   AST 20 0 - 40 IU/L   ALT 14 0 - 32 IU/L  TSH     Status: None   Collection Time: 03/05/23 10:23 AM  Result Value Ref Range   TSH 2.180 0.450 - 4.500 uIU/mL  Hemoglobin A1c     Status: None   Collection Time: 03/05/23 10:23 AM  Result Value Ref Range   Hgb A1c MFr Bld 5.5 4.8 - 5.6 %    Comment:          Prediabetes: 5.7 - 6.4          Diabetes: >6.4          Glycemic control for adults with diabetes: <7.0    Est.  average glucose Bld gHb Est-mCnc 111 mg/dL  Vitamin Y78     Status: None   Collection Time:  03/05/23 10:23 AM  Result Value Ref Range   Vitamin B-12 609 232 - 1,245 pg/mL  POCT XPERT XPRESS SARS COVID-2/FLU/RSV     Status: None   Collection Time: 03/20/23 11:49 AM  Result Value Ref Range   SARS Coronavirus 2 Negative    FLU A Negative    FLU B Negative    RSV RNA, PCR Negative        Assessment & Plan:   Problem List Items Addressed This Visit       Active Problems   Vitamin D deficiency    Checking labs today.  Will continue supplements as needed.        Fibromyalgia syndrome    Patient stable.  Well controlled with current therapy.   Continue current meds.       Mixed hyperlipidemia    Patient opts to try diet and lifestyle modifications to continue decreasing her cholesterol.  Agrees to try for 3 months until follow up.   Will recheck labs and determine next steps at that time.       Chronic heel pain, left    Setting up referral to Podiatry.  X-ray is not officially read yet, but she does have what appears to be a significant spur on her heel.  Will defer to them for further treatment.      Relevant Orders   Ambulatory referral to Podiatry   Other Visit Diagnoses     Upper respiratory tract infection, unspecified type    -  Primary   COVID/Flu/RSV all negative.  Will send antibiotics and prednisone for her.  Pt. to let me know if this does not improve.   Relevant Medications   azithromycin (ZITHROMAX) 250 MG tablet   Other Relevant Orders   POCT XPERT XPRESS SARS COVID-2/FLU/RSV (Completed)       Return in about 3 months (around 06/20/2023) for AWV, F/U.   Total time spent: 20 minutes  Miki Kins, FNP  03/20/2023   This document may have been prepared by [facility] Voice Recognition software and as such may include unintentional dictation errors.

## 2023-03-21 ENCOUNTER — Encounter: Payer: Self-pay | Admitting: Family

## 2023-03-21 DIAGNOSIS — G8929 Other chronic pain: Secondary | ICD-10-CM | POA: Insufficient documentation

## 2023-03-21 DIAGNOSIS — E782 Mixed hyperlipidemia: Secondary | ICD-10-CM | POA: Insufficient documentation

## 2023-03-21 NOTE — Assessment & Plan Note (Signed)
Patient stable.  Well controlled with current therapy.   Continue current meds.  

## 2023-03-21 NOTE — Assessment & Plan Note (Signed)
Setting up referral to Podiatry.  X-ray is not officially read yet, but she does have what appears to be a significant spur on her heel.  Will defer to them for further treatment.

## 2023-03-21 NOTE — Assessment & Plan Note (Signed)
Checking labs today.  Will continue supplements as needed.  

## 2023-03-21 NOTE — Assessment & Plan Note (Signed)
Patient opts to try diet and lifestyle modifications to continue decreasing her cholesterol.  Agrees to try for 3 months until follow up.   Will recheck labs and determine next steps at that time.

## 2023-04-07 DIAGNOSIS — J441 Chronic obstructive pulmonary disease with (acute) exacerbation: Secondary | ICD-10-CM | POA: Diagnosis not present

## 2023-04-18 DIAGNOSIS — M7732 Calcaneal spur, left foot: Secondary | ICD-10-CM | POA: Diagnosis not present

## 2023-04-18 DIAGNOSIS — G8929 Other chronic pain: Secondary | ICD-10-CM | POA: Diagnosis not present

## 2023-04-18 DIAGNOSIS — M722 Plantar fascial fibromatosis: Secondary | ICD-10-CM | POA: Diagnosis not present

## 2023-04-18 DIAGNOSIS — Z87891 Personal history of nicotine dependence: Secondary | ICD-10-CM | POA: Diagnosis not present

## 2023-04-18 DIAGNOSIS — M216X2 Other acquired deformities of left foot: Secondary | ICD-10-CM | POA: Diagnosis not present

## 2023-04-18 DIAGNOSIS — M216X1 Other acquired deformities of right foot: Secondary | ICD-10-CM | POA: Diagnosis not present

## 2023-04-18 DIAGNOSIS — M79672 Pain in left foot: Secondary | ICD-10-CM | POA: Diagnosis not present

## 2023-06-11 ENCOUNTER — Other Ambulatory Visit: Payer: Self-pay | Admitting: Family

## 2023-06-11 MED ORDER — BREZTRI AEROSPHERE 160-9-4.8 MCG/ACT IN AERO: 2.0000 | INHALATION_SPRAY | Freq: Two times a day (BID) | RESPIRATORY_TRACT | 2 refills | Status: DC

## 2023-06-21 ENCOUNTER — Ambulatory Visit (INDEPENDENT_AMBULATORY_CARE_PROVIDER_SITE_OTHER): Payer: Medicare HMO | Admitting: Family

## 2023-06-21 ENCOUNTER — Encounter: Payer: Self-pay | Admitting: Family

## 2023-06-21 VITALS — BP 116/70 | HR 91 | Ht 62.0 in | Wt 167.8 lb

## 2023-06-21 DIAGNOSIS — E559 Vitamin D deficiency, unspecified: Secondary | ICD-10-CM | POA: Diagnosis not present

## 2023-06-21 DIAGNOSIS — R7303 Prediabetes: Secondary | ICD-10-CM

## 2023-06-21 DIAGNOSIS — Z1159 Encounter for screening for other viral diseases: Secondary | ICD-10-CM

## 2023-06-21 DIAGNOSIS — R5383 Other fatigue: Secondary | ICD-10-CM | POA: Diagnosis not present

## 2023-06-21 DIAGNOSIS — I10 Essential (primary) hypertension: Secondary | ICD-10-CM | POA: Diagnosis not present

## 2023-06-21 DIAGNOSIS — E538 Deficiency of other specified B group vitamins: Secondary | ICD-10-CM | POA: Diagnosis not present

## 2023-06-21 DIAGNOSIS — E039 Hypothyroidism, unspecified: Secondary | ICD-10-CM | POA: Diagnosis not present

## 2023-06-21 DIAGNOSIS — E782 Mixed hyperlipidemia: Secondary | ICD-10-CM | POA: Diagnosis not present

## 2023-06-21 MED ORDER — CEPHALEXIN 500 MG PO CAPS: 500.0000 mg | ORAL_CAPSULE | Freq: Three times a day (TID) | ORAL | 0 refills | Status: AC

## 2023-06-21 NOTE — Assessment & Plan Note (Signed)
Checking labs today.  Will continue supplements as needed.  

## 2023-06-21 NOTE — Assessment & Plan Note (Signed)
Checking labs today.  Continue current therapy for lipid control. Will modify as needed based on labwork results.  

## 2023-06-21 NOTE — Assessment & Plan Note (Signed)
Blood pressure well controlled with current medications.  Continue current therapy.  Will reassess at follow up.  

## 2023-06-21 NOTE — Progress Notes (Signed)
Established Patient Office Visit  Subjective:  Patient ID: Karen Russell, female    DOB: March 10, 1976  Age: 48 y.o. MRN: [id number]  Chief Complaint  Patient presents with   Follow-up    3 month follow up    Patient is here today for her 3 months follow up.  She has been feeling fairly well since last appointment.   She does not have additional concerns to discuss today.   Went to the dentist, took 10 then took the rest of them at a second appointment.  Waiting for the end of the month to get fitted for dentures.   Labs are due today. She needs refills.   I have reviewed her active problem list, medication list, allergies, health maintenance, notes from last encounter, lab results for her appointment today.    No other concerns at this time.   Past Medical History:  Diagnosis Date   Acute bronchitis 09/30/2017   Arthritis    COPD (chronic obstructive pulmonary disease) (HCC)    Fibromyalgia syndrome    Hyperlipemia    Hypertension    Respiratory distress, acute 09/30/2017   Respiratory failure with hypoxia (HCC) 08/10/2016    Past Surgical History:  Procedure Laterality Date   BREAST BIOPSY Left 09/05/2018   stereo biopsy/ x clip/ path pending   BREAST BIOPSY Left 09/05/2018   US biopsy/ path pending   KIDNEY SURGERY     TUBAL LIGATION      Social History   Socioeconomic History   Marital status: Single    Spouse name: Not on file   Number of children: Not on file   Years of education: Not on file   Highest education level: Not on file  Occupational History   Not on file  Tobacco Use   Smoking status: Every Day    Current packs/day: 0.50    Types: Cigarettes   Smokeless tobacco: Never  Vaping Use   Vaping status: Never Used  Substance and Sexual Activity   Alcohol use: No   Drug use: No   Sexual activity: Yes  Other Topics Concern   Not on file  Social History Narrative   Not on file   Social Drivers of Health   Financial Resource Strain:  Unknown (09/30/2017)   Overall Financial Resource Strain (CARDIA)    Difficulty of Paying Living Expenses: Patient declined  Food Insecurity: Unknown (09/30/2017)   Hunger Vital Sign    Worried About Running Out of Food in the Last Year: Patient declined    Ran Out of Food in the Last Year: Patient declined  Transportation Needs: Unknown (09/30/2017)   PRAPARE - Administrator, Civil Service (Medical): Patient declined    Lack of Transportation (Non-Medical): Patient declined  Physical Activity: Unknown (09/30/2017)   Exercise Vital Sign    Days of Exercise per Week: Patient declined    Minutes of Exercise per Session: Patient declined  Stress: Unknown (09/30/2017)   [employer] of Occupational Health - Occupational Stress Questionnaire    Feeling of Stress : Patient declined  Social Connections: Unknown (09/30/2017)   Social Connection and Isolation Panel [NHANES]    Frequency of Communication with Friends and Family: Patient declined    Frequency of Social Gatherings with Friends and Family: Patient declined    Attends Religious Services: Patient declined    Database administrator or Organizations: Patient declined    Attends Banker Meetings: Patient declined    Marital Status: Patient  declined  Intimate Partner Violence: Unknown (09/30/2017)   Humiliation, Afraid, Rape, and Kick questionnaire    Fear of Current or Ex-Partner: Patient declined    Emotionally Abused: Patient declined    Physically Abused: Patient declined    Sexually Abused: Patient declined    Family History  Problem Relation Age of Onset   Breast cancer Paternal Aunt    Breast cancer Cousin     Allergies  Allergen Reactions   Augmentin [Amoxicillin-Pot Clavulanate] Nausea And Vomiting    Review of Systems  All other systems reviewed and are negative.      Objective:   BP 116/70   Pulse 91   Ht 5\' 2"  (1.575 m)   Wt 167 lb 12.8 oz (76.1 kg)   SpO2 97%   BMI 30.69  kg/m   Vitals:   06/21/23 1014  BP: 116/70  Pulse: 91  Height: 5\' 2"  (1.575 m)  Weight: 167 lb 12.8 oz (76.1 kg)  SpO2: 97%  BMI (Calculated): 30.68    Physical Exam Vitals and nursing note reviewed.  Constitutional:      Appearance: Normal appearance. She is normal weight.  HENT:     Head: Normocephalic.  Eyes:     Extraocular Movements: Extraocular movements intact.     Conjunctiva/sclera: Conjunctivae normal.     Pupils: Pupils are equal, round, and reactive to light.  Cardiovascular:     Rate and Rhythm: Normal rate.  Pulmonary:     Effort: Pulmonary effort is normal.  Neurological:     General: No focal deficit present.     Mental Status: She is alert and oriented to person, place, and time. Mental status is at baseline.  Psychiatric:        Mood and Affect: Mood normal.        Behavior: Behavior normal.        Thought Content: Thought content normal.        Judgment: Judgment normal.      No results found for any visits on 06/21/23.  No results found for this or any previous visit (from the past 2160 hours).     Assessment & Plan:   Problem List Items Addressed This Visit       Cardiovascular and Mediastinum   HTN (hypertension)   Blood pressure well controlled with current medications.  Continue current therapy.  Will reassess at follow up.        Relevant Orders   CMP14+EGFR   CBC with Diff     Other   Vitamin D deficiency - Primary   Checking labs today.  Will continue supplements as needed.        Relevant Orders   VITAMIN D 25 Hydroxy (Vit-D Deficiency, Fractures)   CMP14+EGFR   CBC with Diff   Mixed hyperlipidemia   Checking labs today.  Continue current therapy for lipid control. Will modify as needed based on labwork results.        Relevant Orders   Lipid panel   CMP14+EGFR   CBC with Diff   Other Visit Diagnoses       Hypothyroidism (acquired)       Checking labs today.  Will continue supplements as needed.    Relevant Orders   CMP14+EGFR   CBC with Diff     Other fatigue       Relevant Orders   CMP14+EGFR   TSH   CBC with Diff     Need for hepatitis C screening test  Test ordered in office today. Will call with results.   Relevant Orders   CMP14+EGFR   CBC with Diff   Hepatitis C Ab reflex to Quant PCR     Prediabetes       A1C is in prediabetic ranges. Patient counseled on dietary choices and verbalized understanding. Will reassess at follow up after next lab check.   Relevant Orders   CMP14+EGFR   Hemoglobin A1c   CBC with Diff     B12 deficiency due to diet       Checking labs today.  Will continue supplements as needed.   Relevant Orders   CMP14+EGFR   Vitamin B12   CBC with Diff       Return in about 3 months (around 09/19/2023) for CPE/PAP, AWV.   Total time spent: 30 minutes  Miki Kins, FNP  06/21/2023   This document may have been prepared by [facility] Voice Recognition software and as such may include unintentional dictation errors.

## 2023-06-22 LAB — CBC WITH DIFFERENTIAL/PLATELET
Basophils Absolute: 0.1 10*3/uL (ref 0.0–0.2)
Basos: 1 %
EOS (ABSOLUTE): 0.1 10*3/uL (ref 0.0–0.4)
Eos: 1 %
Hematocrit: 44.2 % (ref 34.0–46.6)
Hemoglobin: 14.3 g/dL (ref 11.1–15.9)
Immature Grans (Abs): 0 10*3/uL (ref 0.0–0.1)
Immature Granulocytes: 0 %
Lymphocytes Absolute: 1.7 10*3/uL (ref 0.7–3.1)
Lymphs: 17 %
MCH: 29.9 pg (ref 26.6–33.0)
MCHC: 32.4 g/dL (ref 31.5–35.7)
MCV: 93 fL (ref 79–97)
Monocytes Absolute: 0.4 10*3/uL (ref 0.1–0.9)
Monocytes: 4 %
Neutrophils Absolute: 7.8 10*3/uL — ABNORMAL HIGH (ref 1.4–7.0)
Neutrophils: 77 %
Platelets: 396 10*3/uL (ref 150–450)
RBC: 4.78 x10E6/uL (ref 3.77–5.28)
RDW: 13.5 % (ref 11.7–15.4)
WBC: 10.1 10*3/uL (ref 3.4–10.8)

## 2023-06-22 LAB — CMP14+EGFR
ALT: 14 [IU]/L (ref 0–32)
AST: 19 [IU]/L (ref 0–40)
Albumin: 4.3 g/dL (ref 3.9–4.9)
Alkaline Phosphatase: 97 [IU]/L (ref 44–121)
BUN/Creatinine Ratio: 12 (ref 9–23)
BUN: 10 mg/dL (ref 6–24)
Bilirubin Total: <0.2 mg/dL (ref 0.0–1.2)
CO2: 22 mmol/L (ref 20–29)
Calcium: 9.6 mg/dL (ref 8.7–10.2)
Chloride: 101 mmol/L (ref 96–106)
Creatinine, Ser: 0.85 mg/dL (ref 0.57–1.00)
Globulin, Total: 2.2 g/dL (ref 1.5–4.5)
Glucose: 86 mg/dL (ref 70–99)
Potassium: 4.9 mmol/L (ref 3.5–5.2)
Sodium: 138 mmol/L (ref 134–144)
Total Protein: 6.5 g/dL (ref 6.0–8.5)
eGFR: 84 mL/min/{1.73_m2} (ref >59)

## 2023-06-22 LAB — TSH: TSH: 1.57 u[IU]/mL (ref 0.450–4.500)

## 2023-06-22 LAB — HCV AB W REFLEX TO QUANT PCR: HCV Ab: NONREACTIVE

## 2023-06-22 LAB — LIPID PANEL
Chol/HDL Ratio: 4 {ratio} (ref 0.0–4.4)
Cholesterol, Total: 254 mg/dL — ABNORMAL HIGH (ref 100–199)
HDL: 63 mg/dL (ref >39)
LDL Chol Calc (NIH): 165 mg/dL — ABNORMAL HIGH (ref 0–99)
Triglycerides: 147 mg/dL (ref 0–149)
VLDL Cholesterol Cal: 26 mg/dL (ref 5–40)

## 2023-06-22 LAB — HEMOGLOBIN A1C
Est. average glucose Bld gHb Est-mCnc: 108 mg/dL
Hgb A1c MFr Bld: 5.4 % (ref 4.8–5.6)

## 2023-06-22 LAB — VITAMIN D 25 HYDROXY (VIT D DEFICIENCY, FRACTURES): Vit D, 25-Hydroxy: 29.4 ng/mL — ABNORMAL LOW (ref 30.0–100.0)

## 2023-06-22 LAB — VITAMIN B12: Vitamin B-12: 641 pg/mL (ref 232–1245)

## 2023-06-22 LAB — HCV INTERPRETATION

## 2023-07-08 DIAGNOSIS — R051 Acute cough: Secondary | ICD-10-CM | POA: Diagnosis not present

## 2023-07-08 DIAGNOSIS — U071 COVID-19: Secondary | ICD-10-CM | POA: Diagnosis not present

## 2023-07-08 DIAGNOSIS — J40 Bronchitis, not specified as acute or chronic: Secondary | ICD-10-CM | POA: Diagnosis not present

## 2023-07-08 DIAGNOSIS — Z20822 Contact with and (suspected) exposure to covid-19: Secondary | ICD-10-CM | POA: Diagnosis not present

## 2023-07-16 ENCOUNTER — Other Ambulatory Visit: Payer: Self-pay

## 2023-07-16 MED ORDER — NEXLIZET 180-10 MG PO TABS: 1.0000 | ORAL_TABLET | Freq: Every day | ORAL | 0 refills | Status: DC

## 2023-07-23 ENCOUNTER — Other Ambulatory Visit: Payer: Self-pay

## 2023-07-23 MED ORDER — GABAPENTIN 800 MG PO TABS
800.0000 mg | ORAL_TABLET | Freq: Three times a day (TID) | ORAL | 1 refills | Status: DC
Start: 2023-07-23 — End: 2023-09-19

## 2023-08-16 ENCOUNTER — Other Ambulatory Visit: Payer: Self-pay | Admitting: Family

## 2023-09-05 ENCOUNTER — Other Ambulatory Visit: Payer: Self-pay | Admitting: Family

## 2023-09-19 ENCOUNTER — Encounter: Payer: Self-pay | Admitting: Family

## 2023-09-19 ENCOUNTER — Ambulatory Visit: Payer: Medicare HMO | Admitting: Family

## 2023-09-19 VITALS — BP 130/86 | HR 101 | Ht 62.0 in | Wt 167.6 lb

## 2023-09-19 DIAGNOSIS — Z0001 Encounter for general adult medical examination with abnormal findings: Secondary | ICD-10-CM

## 2023-09-19 DIAGNOSIS — R5383 Other fatigue: Secondary | ICD-10-CM | POA: Diagnosis not present

## 2023-09-19 DIAGNOSIS — Z23 Encounter for immunization: Secondary | ICD-10-CM | POA: Diagnosis not present

## 2023-09-19 DIAGNOSIS — E782 Mixed hyperlipidemia: Secondary | ICD-10-CM | POA: Diagnosis not present

## 2023-09-19 DIAGNOSIS — E538 Deficiency of other specified B group vitamins: Secondary | ICD-10-CM | POA: Diagnosis not present

## 2023-09-19 DIAGNOSIS — R7303 Prediabetes: Secondary | ICD-10-CM

## 2023-09-19 DIAGNOSIS — I10 Essential (primary) hypertension: Secondary | ICD-10-CM

## 2023-09-19 DIAGNOSIS — Z599 Problem related to housing and economic circumstances, unspecified: Secondary | ICD-10-CM

## 2023-09-19 DIAGNOSIS — Z0000 Encounter for general adult medical examination without abnormal findings: Secondary | ICD-10-CM

## 2023-09-19 DIAGNOSIS — Z01419 Encounter for gynecological examination (general) (routine) without abnormal findings: Secondary | ICD-10-CM

## 2023-09-19 DIAGNOSIS — E559 Vitamin D deficiency, unspecified: Secondary | ICD-10-CM

## 2023-09-19 DIAGNOSIS — J449 Chronic obstructive pulmonary disease, unspecified: Secondary | ICD-10-CM

## 2023-09-19 MED ORDER — ALBUTEROL SULFATE HFA 108 (90 BASE) MCG/ACT IN AERS: 1.0000 | INHALATION_SPRAY | Freq: Four times a day (QID) | RESPIRATORY_TRACT | 3 refills | Status: DC | PRN

## 2023-09-19 MED ORDER — FLUTICASONE PROPIONATE 50 MCG/ACT NA SUSP: 1.0000 | Freq: Every day | NASAL | 3 refills | Status: AC

## 2023-09-19 MED ORDER — OMEPRAZOLE 40 MG PO CPDR: 40.0000 mg | DELAYED_RELEASE_CAPSULE | Freq: Every day | ORAL | 1 refills | Status: DC

## 2023-09-19 MED ORDER — ATENOLOL 25 MG PO TABS: 25.0000 mg | ORAL_TABLET | Freq: Every day | ORAL | 2 refills | Status: DC

## 2023-09-19 MED ORDER — BREZTRI AEROSPHERE 160-9-4.8 MCG/ACT IN AERO: 2.0000 | INHALATION_SPRAY | Freq: Two times a day (BID) | RESPIRATORY_TRACT | 3 refills | Status: DC

## 2023-09-19 MED ORDER — MONTELUKAST SODIUM 10 MG PO TABS: 10.0000 mg | ORAL_TABLET | Freq: Every day | ORAL | 1 refills | Status: AC

## 2023-09-19 MED ORDER — ROSUVASTATIN CALCIUM 40 MG PO TABS: 40.0000 mg | ORAL_TABLET | Freq: Every day | ORAL | 1 refills | Status: DC

## 2023-09-19 MED ORDER — NEXLIZET 180-10 MG PO TABS: 1.0000 | ORAL_TABLET | Freq: Every day | ORAL | 3 refills | Status: AC

## 2023-09-19 MED ORDER — GABAPENTIN 800 MG PO TABS: 800.0000 mg | ORAL_TABLET | Freq: Three times a day (TID) | ORAL | 1 refills | Status: DC

## 2023-09-19 NOTE — Progress Notes (Signed)
 Annual Wellness Visit  Patient: Karen Russell, Female    DOB: [birth year], 48 y.o.   MRN: [id number] Visit Date: 09/19/2023  Today's Provider: Miki Kins, FNP  Subjective:    Chief Complaint  Patient presents with   Annual Exam    AWV   Karen Russell is a 48 y.o. female who presents today for her Annual Wellness Visit.  Patient here without any major concerns today.  She is due for refills, needs her pap smear updated, and also needs TDaP vaccine updated.  She asks if we can set up an OB/GYN for her to have her pap done.   No other concerns today.    Past Medical History:  Diagnosis Date   Acute bronchitis 09/30/2017   Arthritis    COPD (chronic obstructive pulmonary disease) (HCC)    Fibromyalgia syndrome    Hyperlipemia    Hypertension    Respiratory distress, acute 09/30/2017   Respiratory failure with hypoxia (HCC) 08/10/2016   Past Surgical History:  Procedure Laterality Date   BREAST BIOPSY Left 09/05/2018   stereo biopsy/ x clip/ path pending   BREAST BIOPSY Left 09/05/2018   US biopsy/ path pending   KIDNEY SURGERY     TUBAL LIGATION     Family History  Problem Relation Age of Onset   Breast cancer Paternal Aunt    Breast cancer Cousin    Social History   Socioeconomic History   Marital status: Single    Spouse name: Not on file   Number of children: Not on file   Years of education: Not on file   Highest education level: Not on file  Occupational History   Not on file  Tobacco Use   Smoking status: Every Day    Current packs/day: 0.50    Types: Cigarettes   Smokeless tobacco: Never  Vaping Use   Vaping status: Never Used  Substance and Sexual Activity   Alcohol use: No   Drug use: No   Sexual activity: Yes  Other Topics Concern   Not on file  Social History Narrative   Not on file   Social Drivers of Health   Financial Resource Strain: Unknown (09/30/2017)   Overall Financial Resource Strain (CARDIA)    Difficulty of  Paying Living Expenses: Patient declined  Food Insecurity: Food Insecurity Present (09/19/2023)   Hunger Vital Sign    Worried About Running Out of Food in the Last Year: Sometimes true    Ran Out of Food in the Last Year: Sometimes true  Transportation Needs: No Transportation Needs (09/19/2023)   PRAPARE - Administrator, Civil Service (Medical): No    Lack of Transportation (Non-Medical): No  Physical Activity: Unknown (09/30/2017)   Exercise Vital Sign    Days of Exercise per Week: Patient declined    Minutes of Exercise per Session: Patient declined  Stress: Unknown (09/30/2017)   [employer] of Occupational Health - Occupational Stress Questionnaire    Feeling of Stress : Patient declined  Social Connections: Unknown (09/30/2017)   Social Connection and Isolation Panel [NHANES]    Frequency of Communication with Friends and Family: Patient declined    Frequency of Social Gatherings with Friends and Family: Patient declined    Attends Religious Services: Patient declined    Active Member of Clubs or Organizations: Patient declined    Attends Banker Meetings: Patient declined    Marital Status: Patient declined  Intimate Partner Violence: Unknown (09/30/2017)  Humiliation, Afraid, Rape, and Kick questionnaire    Fear of Current or Ex-Partner: Patient declined    Emotionally Abused: Patient declined    Physically Abused: Patient declined    Sexually Abused: Patient declined    Medications: Outpatient Medications Prior to Visit  Medication Sig   [DISCONTINUED] albuterol (VENTOLIN HFA) 108 (90 Base) MCG/ACT inhaler 1-2 puffs every 6 (six) hours as needed for wheezing or shortness of breath.   [DISCONTINUED] atenolol (TENORMIN) 25 MG tablet Take 1 tablet (25 mg total) by mouth daily.   [DISCONTINUED] BREZTRI AEROSPHERE 160-9-4.8 MCG/ACT AERO INHALE 2 PUFFS INTO THE LUNGS IN THE MORNING AND AT BEDTIME   [DISCONTINUED] fluticasone (FLONASE) 50 MCG/ACT  nasal spray Place 1 spray into both nostrils daily.   [DISCONTINUED] gabapentin (NEURONTIN) 800 MG tablet Take 1 tablet (800 mg total) by mouth 3 (three) times daily.   [DISCONTINUED] ibuprofen (ADVIL) 200 MG tablet Take 800 mg by mouth every 6 (six) hours as needed.   [DISCONTINUED] ipratropium-albuterol (DUONEB) 0.5-2.5 (3) MG/3ML SOLN Take 3 mLs by nebulization every 6 (six) hours as needed.   [DISCONTINUED] montelukast (SINGULAIR) 10 MG tablet Take 10 mg by mouth at bedtime.   [DISCONTINUED] NEXLIZET 180-10 MG TABS TAKE 1 TABLET BY MOUTH DAILY   [DISCONTINUED] omeprazole (PRILOSEC) 40 MG capsule Take 40 mg by mouth daily.   [DISCONTINUED] rOPINIRole (REQUIP) 0.5 MG tablet Take 0.5 mg by mouth at bedtime.   [DISCONTINUED] rosuvastatin (CRESTOR) 40 MG tablet Take 40 mg by mouth daily.   [DISCONTINUED] tiZANidine (ZANAFLEX) 4 MG tablet 4 mg every 6 (six) hours as needed for muscle spasms.   [DISCONTINUED] traMADol (ULTRAM) 50 MG tablet Take 50 mg by mouth 3 (three) times daily as needed. (Patient not taking: Reported on 09/19/2023)   No facility-administered medications prior to visit.    Allergies  Allergen Reactions   Augmentin [Amoxicillin-Pot Clavulanate] Nausea And Vomiting    Patient Care Team: Trenda Frisk, FNP as PCP - General (Family Medicine)  Review of Systems  All other systems reviewed and are negative.      Objective:    Vitals: BP 130/86   Pulse (!) 101   Ht 5\' 2"  (1.575 m)   Wt 167 lb 9.6 oz (76 kg)   SpO2 99%   BMI 30.65 kg/m    Physical Exam Vitals and nursing note reviewed.  Constitutional:      Appearance: Normal appearance. She is obese.  HENT:     Head: Normocephalic.  Eyes:     Extraocular Movements: Extraocular movements intact.     Conjunctiva/sclera: Conjunctivae normal.     Pupils: Pupils are equal, round, and reactive to light.  Cardiovascular:     Rate and Rhythm: Normal rate.  Pulmonary:     Effort: Pulmonary effort is normal.   Neurological:     General: No focal deficit present.     Mental Status: She is alert and oriented to person, place, and time. Mental status is at baseline.  Psychiatric:        Mood and Affect: Mood normal.        Behavior: Behavior normal.        Thought Content: Thought content normal.      Most recent functional status assessment:    09/19/2023   10:35 AM  In your present state of health, do you have any difficulty performing the following activities:  Hearing? 0  Vision? 0  Difficulty concentrating or making decisions? 0  Walking or climbing stairs?  1  Comment if too many  Dressing or bathing? 0  Doing errands, shopping? 0    Most recent fall risk assessment:    09/19/2023   10:36 AM  Fall Risk   Falls in the past year? 0  Number falls in past yr: 0  Injury with Fall? 0  Risk for fall due to : No Fall Risks     Most recent depression screenings:    09/19/2023   10:47 AM 03/07/2023    3:43 PM  PHQ 2/9 Scores  PHQ - 2 Score 0 0  PHQ- 9 Score 0 0    Most recent cognitive screening:    09/19/2023   10:40 AM  6CIT Screen  What Year? 0 points  What month? 0 points  What time? 0 points  Count back from 20 0 points  Months in reverse 0 points  Repeat phrase 0 points  Total Score 0 points    No results found for any visits on 09/19/23.     Assessment & Plan:      Annual wellness visit done today including the all of the following: Reviewed patient's Family Medical History Reviewed and updated list of patient's medical providers Assessment of cognitive impairment was done Assessed patient's functional ability Established a written schedule for health screening services Health Risk Assessent Completed and Reviewed  Exercise Activities and Dietary recommendations  Goals   None     Immunization History  Administered Date(s) Administered   Influenza, Mdck, Trivalent,PF 6+ MOS(egg free) 03/05/2023   Influenza,inj,Quad PF,6+ Mos 05/28/2017    Pneumococcal Polysaccharide-23 05/28/2017   Tdap 09/19/2023    Health Maintenance  Topic Date Due   Cervical Cancer Screening (HPV/Pap Cotest)  Never done   Pneumococcal Vaccine 79-56 Years old (2 of 2 - PCV) 05/28/2018   Colonoscopy  Never done   COVID-19 Vaccine (1 - 2024-25 season) Never done   INFLUENZA VACCINE  01/04/2024   Medicare Annual Wellness (AWV)  09/18/2024   DTaP/Tdap/Td (2 - Td or Tdap) 09/18/2033   Hepatitis C Screening  Completed   HIV Screening  Completed   HPV VACCINES  Aged Out   Meningococcal B Vaccine  Aged Out     Discussed health benefits of physical activity, and encouraged her to engage in regular exercise appropriate for her age and condition.       Trenda Frisk, FNP   09/19/2023  This document may have been prepared by Dragon Voice Recognition software and as such may include unintentional dictation errors.

## 2023-09-20 LAB — CBC WITH DIFFERENTIAL/PLATELET
Basophils Absolute: 0.1 10*3/uL (ref 0.0–0.2)
Basos: 1 %
EOS (ABSOLUTE): 0.5 10*3/uL — ABNORMAL HIGH (ref 0.0–0.4)
Eos: 5 %
Hematocrit: 44 % (ref 34.0–46.6)
Hemoglobin: 14.5 g/dL (ref 11.1–15.9)
Immature Grans (Abs): 0 10*3/uL (ref 0.0–0.1)
Immature Granulocytes: 0 %
Lymphocytes Absolute: 2.2 10*3/uL (ref 0.7–3.1)
Lymphs: 20 %
MCH: 30.3 pg (ref 26.6–33.0)
MCHC: 33 g/dL (ref 31.5–35.7)
MCV: 92 fL (ref 79–97)
Monocytes Absolute: 0.8 10*3/uL (ref 0.1–0.9)
Monocytes: 7 %
Neutrophils Absolute: 7.2 10*3/uL — ABNORMAL HIGH (ref 1.4–7.0)
Neutrophils: 67 %
Platelets: 346 10*3/uL (ref 150–450)
RBC: 4.78 x10E6/uL (ref 3.77–5.28)
RDW: 14 % (ref 11.7–15.4)
WBC: 10.7 10*3/uL (ref 3.4–10.8)

## 2023-09-20 LAB — LIPID PANEL
Chol/HDL Ratio: 3.7 ratio (ref 0.0–4.4)
Cholesterol, Total: 261 mg/dL — ABNORMAL HIGH (ref 100–199)
HDL: 71 mg/dL (ref >39)
LDL Chol Calc (NIH): 174 mg/dL — ABNORMAL HIGH (ref 0–99)
Triglycerides: 93 mg/dL (ref 0–149)
VLDL Cholesterol Cal: 16 mg/dL (ref 5–40)

## 2023-09-20 LAB — CMP14+EGFR
ALT: 12 IU/L (ref 0–32)
AST: 16 IU/L (ref 0–40)
Albumin: 4.2 g/dL (ref 3.9–4.9)
Alkaline Phosphatase: 87 IU/L (ref 44–121)
BUN/Creatinine Ratio: 13 (ref 9–23)
BUN: 10 mg/dL (ref 6–24)
Bilirubin Total: <0.2 mg/dL (ref 0.0–1.2)
CO2: 20 mmol/L (ref 20–29)
Calcium: 9.4 mg/dL (ref 8.7–10.2)
Chloride: 104 mmol/L (ref 96–106)
Creatinine, Ser: 0.77 mg/dL (ref 0.57–1.00)
Globulin, Total: 2.5 g/dL (ref 1.5–4.5)
Glucose: 101 mg/dL — ABNORMAL HIGH (ref 70–99)
Potassium: 4.1 mmol/L (ref 3.5–5.2)
Sodium: 139 mmol/L (ref 134–144)
Total Protein: 6.7 g/dL (ref 6.0–8.5)
eGFR: 95 mL/min/{1.73_m2} (ref >59)

## 2023-09-20 LAB — HEMOGLOBIN A1C
Est. average glucose Bld gHb Est-mCnc: 105 mg/dL
Hgb A1c MFr Bld: 5.3 % (ref 4.8–5.6)

## 2023-09-20 LAB — VITAMIN B12: Vitamin B-12: 651 pg/mL (ref 232–1245)

## 2023-09-20 LAB — TSH: TSH: 1.45 u[IU]/mL (ref 0.450–4.500)

## 2023-09-20 LAB — VITAMIN D 25 HYDROXY (VIT D DEFICIENCY, FRACTURES): Vit D, 25-Hydroxy: 36 ng/mL (ref 30.0–100.0)

## 2023-09-25 ENCOUNTER — Telehealth: Payer: Self-pay

## 2023-09-25 ENCOUNTER — Other Ambulatory Visit: Payer: Self-pay

## 2023-09-25 DIAGNOSIS — E782 Mixed hyperlipidemia: Secondary | ICD-10-CM

## 2023-09-25 NOTE — Progress Notes (Signed)
 09/25/2023 Name: Karen Russell MRN: [id number] DOB: 1976-02-08  Chief Complaint  Patient presents with   Hyperlipidemia   Medication Management   COPD    Karen Russell is a 48 y.o. year old female who presented for a telephone visit.   They were referred to the pharmacist by their PCP for assistance in managing hyperlipidemia, medication access, and COPD .    Subjective:  Care Team: Primary Care Provider: Trenda Frisk, FNP ; Next Scheduled Visit: 01/22/24  Medication Access/Adherence  Current Pharmacy:  [id number] - [place], [place] - [address] AT [facility] [address] [place] [address] Phone: [phone] Fax: [phone]   Patient reports affordability concerns with their medications: Yes  - working on getting full medicaid approval status within the next month, but nexlizet  and breztri  had been expensive recently Patient reports access/transportation concerns to their pharmacy: No  Patient reports adherence concerns with their medications:  Yes  - due to cost   Medication Management: -Patient reported cost concerns with Breztri  and Nexlizet  as her current medicaid coverage is having limitations (patient says she believes she only has family planning limited coverage but is the process of getting standard full coverage medicaid back). -Patient reports she does have all of her medications at this time and is taking them as prescribed (was not at last PCP visit where LDL came back elevated).   Objective:  Lab Results  Component Value Date   HGBA1C 5.3 09/19/2023    Lab Results  Component Value Date   CREATININE 0.77 09/19/2023   BUN 10 09/19/2023   NA 139 09/19/2023   K 4.1 09/19/2023   CL 104 09/19/2023   CO2 20 09/19/2023    Lab Results  Component Value Date   CHOL 261 (H) 09/19/2023   HDL 71 09/19/2023   LDLCALC 174 (H) 09/19/2023   TRIG 93 09/19/2023   CHOLHDL 3.7 09/19/2023    Medications  Reviewed Today     Reviewed by Carnell Christian, RPH (Pharmacist) on 09/25/23 at 1051  Med List Status: <None>   Medication Order Taking? Sig Documenting Provider Last Dose Status Informant  albuterol  (VENTOLIN  HFA) 108 (90 Base) MCG/ACT inhaler [id number] Yes Inhale 1-2 puffs into the lungs every 6 (six) hours as needed for wheezing or shortness of breath. Trenda Frisk, FNP Taking Active   atenolol  (TENORMIN ) 25 MG tablet [id number] Yes Take 1 tablet (25 mg total) by mouth daily. Trenda Frisk, FNP Taking Active   Bempedoic Acid-Ezetimibe (NEXLIZET ) 180-10 MG TABS [id number] Yes Take 1 tablet by mouth daily. Trenda Frisk, FNP Taking Active   BREZTRI  AEROSPHERE 160-9-4.8 MCG/ACT AERO inhaler [id number] Yes Inhale 2 puffs into the lungs in the morning and at bedtime. Trenda Frisk, FNP Taking Active   fluticasone  (FLONASE ) 50 MCG/ACT nasal spray [id number] Yes Place 1 spray into both nostrils daily. Trenda Frisk, FNP Taking Active   gabapentin  (NEURONTIN ) 800 MG tablet [id number] Yes Take 1 tablet (800 mg total) by mouth 3 (three) times daily. Trenda Frisk, FNP Taking Active   montelukast  (SINGULAIR ) 10 MG tablet [id number] Yes Take 1 tablet (10 mg total) by mouth at bedtime. Trenda Frisk, FNP Taking Active   omeprazole  (PRILOSEC) 40 MG capsule [id number] Yes Take 1 capsule (40 mg total) by mouth daily. Trenda Frisk, FNP Taking Active   rosuvastatin  (CRESTOR ) 40 MG tablet [id number] Yes Take 1 tablet (40  mg total) by mouth daily. Trenda Frisk, FNP Taking Active               Assessment/Plan:   Medication Management: -Reviewed with patient that she may qualify to get Breztri  and Nexlizet  for free through [employer] respectively, if she does not obtain medicaid coverage and stays on a medicare plan.  -Patient declines to pursue any patient assistance at this time, will save my contact info and reach back out if she is denied medicaid  coverage and med cost concerns continue.   Follow Up Plan: Patient prefers to reach out if med cost concerns continue  Carnell Christian, PharmD Clinical Pharmacist [phone]

## 2023-09-25 NOTE — Progress Notes (Signed)
   Telephone encounter was:  Successful.  Complex Care Management Note Care Guide Note  09/25/2023 Name: Karen Russell MRN: [id number] DOB: November 22, 1975  Karen Russell is a 48 y.o. year old female who is a primary care patient of Trenda Frisk, FNP . The community resource team was consulted for assistance with Food Insecurity and Financial Difficulties related to FINANCIAL STRAIN  SDOH screenings and interventions completed:  Yes  Social Drivers of Health From This Encounter   Food Insecurity: Food Insecurity Present (09/25/2023)   Hunger Vital Sign    Worried About Running Out of Food in the Last Year: Often true    Ran Out of Food in the Last Year: Often true  Financial Resource Strain: High Risk (09/25/2023)   Overall Financial Resource Strain (CARDIA)    Difficulty of Paying Living Expenses: Very hard  Utilities: At Risk (09/25/2023)   Utilities    Threatened with loss of utilities: Yes    SDOH Interventions Today    Flowsheet Row Most Recent Value  SDOH Interventions   Food Insecurity Interventions Community Resources Provided, [id number] Referral  Utilities Interventions [id number] Referral, Community Resources Provided  Financial Strain Interventions Community Resources Provided        Care guide performed the following interventions: Patient provided with information about care guide support team and interviewed to confirm resource needs. PT is having financial strain paying her utilities and buy food. Pt is requesting resources for [facility]. I have emailed and mailing resources. Resources were also given over the phone   Follow Up Plan:  No further follow up planned at this time. The patient has been provided with needed resources.  Encounter Outcome:  Patient Visit Completed    Azell Leopard [facility]  [facility] Guide, Phone: [phone] Fax: [phone] Website: [email or web address]

## 2023-10-17 ENCOUNTER — Encounter: Admitting: Certified Nurse Midwife

## 2023-10-25 NOTE — Progress Notes (Signed)
 GYNECOLOGY ANNUAL PHYSICAL EXAM PROGRESS NOTE  Subjective:    Karen Russell is a 48 y.o. G10P0 female who presents for an annual exam.  The patient is sexually active. The patient participates in regular exercise: yes. Has the patient ever been transfused or tattooed?: yes. The patient reports that there is not domestic violence in her life.   The patient has the following complaints today: Some pain with intercourse and urinary incontinence with coughing and sneezing.   Menstrual History: Menarche age: 41 Patient's last menstrual period was 10/16/2023 (exact date). Period Cycle (Days): 28 Period Duration (Days): 2-5 Period Pattern: Regular Menstrual Flow: Light Menstrual Control: Maxi pad Menstrual Control Change Freq (Hours): 2-3 Dysmenorrhea: (!) Moderate Dysmenorrhea Symptoms: Cramping   Gynecologic History:  Contraception: tubal ligation History of STI's: Denies Last Pap: unknown. Results were: normal.   Last mammogram: a few years ago. Results were: normal       OB History  Gravida Para Term Preterm AB Living  4 0 0 0 0 0  SAB IAB Ectopic Multiple Live Births  0 0 0 0 0    # Outcome Date GA Lbr Len/2nd Weight Sex Type Anes PTL Lv  4 Gravida           3 Gravida           2 Gravida           1 [place]             Past Medical History:  Diagnosis Date   Acute bronchitis 09/30/2017   Arthritis    COPD (chronic obstructive pulmonary disease) (HCC)    Fibromyalgia syndrome    Hyperlipemia    Hypertension    Respiratory distress, acute 09/30/2017   Respiratory failure with hypoxia (HCC) 08/10/2016    Past Surgical History:  Procedure Laterality Date   BREAST BIOPSY Left 09/05/2018   stereo biopsy/ x clip/ path pending   BREAST BIOPSY Left 09/05/2018   us  biopsy/ path pending   KIDNEY SURGERY     TUBAL LIGATION      Family History  Problem Relation Age of Onset   Breast cancer Paternal Aunt    Breast cancer Cousin     Social History    Socioeconomic History   Marital status: Single    Spouse name: Not on file   Number of children: Not on file   Years of education: Not on file   Highest education level: Not on file  Occupational History   Not on file  Tobacco Use   Smoking status: Every Day    Current packs/day: 0.50    Types: Cigarettes   Smokeless tobacco: Never  Vaping Use   Vaping status: Never Used  Substance and Sexual Activity   Alcohol use: No   Drug use: No   Sexual activity: Yes  Other Topics Concern   Not on file  Social History Narrative   Not on file   Social Drivers of Health   Financial Resource Strain: High Risk (09/25/2023)   Overall Financial Resource Strain (CARDIA)    Difficulty of Paying Living Expenses: Very hard  Food Insecurity: Food Insecurity Present (09/25/2023)   Hunger Vital Sign    Worried About Running Out of Food in the Last Year: Often true    Ran Out of Food in the Last Year: Often true  Transportation Needs: No Transportation Needs (09/19/2023)   PRAPARE - Administrator, Civil Service (Medical): No  Lack of Transportation (Non-Medical): No  Physical Activity: Unknown (09/30/2017)   Exercise Vital Sign    Days of Exercise per Week: Patient declined    Minutes of Exercise per Session: Patient declined  Stress: Unknown (09/30/2017)   [employer] of Occupational Health - Occupational Stress Questionnaire    Feeling of Stress : Patient declined  Social Connections: Unknown (09/30/2017)   Social Connection and Isolation Panel [NHANES]    Frequency of Communication with Friends and Family: Patient declined    Frequency of Social Gatherings with Friends and Family: Patient declined    Attends Religious Services: Patient declined    Database administrator or Organizations: Patient declined    Attends Banker Meetings: Patient declined    Marital Status: Patient declined  Intimate Partner Violence: Unknown (09/30/2017)   Humiliation, Afraid,  Rape, and Kick questionnaire    Fear of Current or Ex-Partner: Patient declined    Emotionally Abused: Patient declined    Physically Abused: Patient declined    Sexually Abused: Patient declined    Current Outpatient Medications on File Prior to Visit  Medication Sig Dispense Refill   albuterol  (VENTOLIN  HFA) 108 (90 Base) MCG/ACT inhaler Inhale 1-2 puffs into the lungs every 6 (six) hours as needed for wheezing or shortness of breath. 18 g 3   atenolol  (TENORMIN ) 25 MG tablet Take 1 tablet (25 mg total) by mouth daily. 90 tablet 2   Bempedoic Acid-Ezetimibe (NEXLIZET ) 180-10 MG TABS Take 1 tablet by mouth daily. 90 tablet 3   BREZTRI  AEROSPHERE 160-9-4.8 MCG/ACT AERO inhaler Inhale 2 puffs into the lungs in the morning and at bedtime. 10.7 g 3   fluticasone  (FLONASE ) 50 MCG/ACT nasal spray Place 1 spray into both nostrils daily. 16 g 3   gabapentin  (NEURONTIN ) 800 MG tablet Take 1 tablet (800 mg total) by mouth 3 (three) times daily. 270 tablet 1   montelukast  (SINGULAIR ) 10 MG tablet Take 1 tablet (10 mg total) by mouth at bedtime. 90 tablet 1   omeprazole  (PRILOSEC) 40 MG capsule Take 1 capsule (40 mg total) by mouth daily. 90 capsule 1   rosuvastatin  (CRESTOR ) 40 MG tablet Take 1 tablet (40 mg total) by mouth daily. 90 tablet 1   No current facility-administered medications on file prior to visit.    Allergies  Allergen Reactions   Augmentin [Amoxicillin -Pot Clavulanate] Nausea And Vomiting     Review of Systems Constitutional: negative for chills, fatigue, fevers and sweats Eyes: negative for irritation, redness and visual disturbance Ears, nose, mouth, throat, and face: negative for hearing loss, nasal congestion, snoring and tinnitus Respiratory: negative for asthma, cough, sputum Cardiovascular: negative for chest pain, dyspnea, exertional chest pressure/discomfort, irregular heart beat, palpitations and syncope Gastrointestinal: negative for abdominal pain, change in bowel  habits, nausea and vomiting Genitourinary: negative for abnormal menstrual periods, genital lesions, sexual problems and vaginal discharge, dysuria and urinary incontinence Integument/breast: negative for breast lump, breast tenderness and nipple discharge Hematologic/lymphatic: negative for bleeding and easy bruising Musculoskeletal:negative for back pain and muscle weakness Neurological: negative for dizziness, headaches, vertigo and weakness Endocrine: negative for diabetic symptoms including polydipsia, polyuria and skin dryness Allergic/Immunologic: negative for hay fever and urticaria      Objective:  Blood pressure 116/78, pulse 69, resp. rate 16, height 5\' 2"  (1.575 m), weight 168 lb 8 oz (76.4 kg), last menstrual period 10/16/2023. Body mass index is 30.82 kg/m.    General Appearance:    Alert, cooperative, no distress, appears stated age  Head:    Normocephalic, without obvious abnormality, atraumatic  Eyes:    PERRL, conjunctiva/corneas clear, EOM's intact, both eyes  Ears:    Normal external ear canals, both ears  Nose:   Nares normal, septum midline, mucosa normal, no drainage or sinus tenderness  Throat:   Lips, mucosa, and tongue normal; teeth and gums normal  Neck:   Supple, symmetrical, trachea midline, no adenopathy; thyroid : no enlargement/tenderness/nodules; no carotid bruit or JVD  Back:     Symmetric, no curvature, ROM normal, no CVA tenderness  Lungs:     Clear to auscultation bilaterally, respirations unlabored  Chest Wall:    No tenderness or deformity   Heart:    Regular rate and rhythm, S1 and S2 normal, no murmur, rub or gallop  Breast Exam:    No tenderness, masses, or nipple abnormality  Abdomen:     Soft, non-tender, bowel sounds active all four quadrants, no masses, no organomegaly.    Genitalia:    Pelvic:external genitalia normal, vagina without lesions, discharge, or tenderness, rectovaginal septum  normal. Cervix normal in appearance, no cervical motion  tenderness, no adnexal masses or tenderness.  Uterus normal size, shape, mobile, regular contours, nontender.  Rectal:    Normal external sphincter.  No hemorrhoids appreciated. Internal exam not done.   Extremities:   Extremities normal, atraumatic, no cyanosis or edema  Pulses:   2+ and symmetric all extremities  Skin:   Skin color, texture, turgor normal, no rashes or lesions  Lymph nodes:   Cervical, supraclavicular, and axillary nodes normal  Neurologic:   CNII-XII intact, normal strength, sensation and reflexes throughout   .  Labs:  Lab Results  Component Value Date   WBC 10.7 09/19/2023   HGB 14.5 09/19/2023   HCT 44.0 09/19/2023   MCV 92 09/19/2023   PLT 346 09/19/2023    Lab Results  Component Value Date   CREATININE 0.77 09/19/2023   BUN 10 09/19/2023   NA 139 09/19/2023   K 4.1 09/19/2023   CL 104 09/19/2023   CO2 20 09/19/2023    Lab Results  Component Value Date   ALT 12 09/19/2023   AST 16 09/19/2023   ALKPHOS 87 09/19/2023   BILITOT <0.2 09/19/2023    Lab Results  Component Value Date   TSH 1.450 09/19/2023     Assessment:   1. Cervical cancer screening   2. Encounter for well woman exam with routine gynecological exam   3. Encounter for screening mammogram for malignant neoplasm of breast      Plan:  Blood tests: Pending. Breast self exam technique reviewed and patient encouraged to perform self-exam monthly. Contraception: tubal ligation. Discussed healthy lifestyle modifications. Mammogram ordered Pap smear ordered. Flu vaccine: Follow up in 1 year for annual exam   Donato Fu, CNM [facility]

## 2023-10-25 NOTE — Patient Instructions (Signed)
 Preventive Care 39-50 Years Old, Female Preventive care refers to lifestyle choices and visits with your health care provider that can promote health and wellness. Preventive care visits are also called wellness exams. What can I expect for my preventive care visit? Counseling Your health care provider may ask you questions about your: Medical history, including: Past medical problems. Family medical history. Pregnancy history. Current health, including: Menstrual cycle. Method of birth control. Emotional well-being. Home life and relationship well-being. Sexual activity and sexual health. Lifestyle, including: Alcohol, nicotine or tobacco, and drug use. Access to firearms. Diet, exercise, and sleep habits. Work and work Astronomer. Sunscreen use. Safety issues such as seatbelt and bike helmet use. Physical exam Your health care provider will check your: Height and weight. These may be used to calculate your BMI (body mass index). BMI is a measurement that tells if you are at a healthy weight. Waist circumference. This measures the distance around your waistline. This measurement also tells if you are at a healthy weight and may help predict your risk of certain diseases, such as type 2 diabetes and high blood pressure. Heart rate and blood pressure. Body temperature. Skin for abnormal spots. What immunizations do I need?  Vaccines are usually given at various ages, according to a schedule. Your health care provider will recommend vaccines for you based on your age, medical history, and lifestyle or other factors, such as travel or where you work. What tests do I need? Screening Your health care provider may recommend screening tests for certain conditions. This may include: Lipid and cholesterol levels. Diabetes screening. This is done by checking your blood sugar (glucose) after you have not eaten for a while (fasting). Pelvic exam and Pap test. Hepatitis B test. Hepatitis C  test. HIV (human immunodeficiency virus) test. STI (sexually transmitted infection) testing, if you are at risk. Lung cancer screening. Colorectal cancer screening. Mammogram. Talk with your health care provider about when you should start having regular mammograms. This may depend on whether you have a family history of breast cancer. BRCA-related cancer screening. This may be done if you have a family history of breast, ovarian, tubal, or peritoneal cancers. Bone density scan. This is done to screen for osteoporosis. Talk with your health care provider about your test results, treatment options, and if necessary, the need for more tests. Follow these instructions at home: Eating and drinking  Eat a diet that includes fresh fruits and vegetables, whole grains, lean protein, and low-fat dairy products. Take vitamin and mineral supplements as recommended by your health care provider. Do not drink alcohol if: Your health care provider tells you not to drink. You are pregnant, may be pregnant, or are planning to become pregnant. If you drink alcohol: Limit how much you have to 0-1 drink a day. Know how much alcohol is in your drink. In the [place] one drink equals one 12 oz bottle of beer (355 mL), one 5 oz glass of wine (148 mL), or one 1 oz glass of hard liquor (44 mL). Lifestyle Brush your teeth every morning and night with fluoride toothpaste. Floss one time each day. Exercise for at least 30 minutes 5 or more days each week. Do not use any products that contain nicotine or tobacco. These products include cigarettes, chewing tobacco, and vaping devices, such as e-cigarettes. If you need help quitting, ask your health care provider. Do not use drugs. If you are sexually active, practice safe sex. Use a condom or other form of protection to  prevent STIs. If you do not wish to become pregnant, use a form of birth control. If you plan to become pregnant, see your health care provider for a  prepregnancy visit. Take aspirin only as told by your health care provider. Make sure that you understand how much to take and what form to take. Work with your health care provider to find out whether it is safe and beneficial for you to take aspirin daily. Find healthy ways to manage stress, such as: Meditation, yoga, or listening to music. Journaling. Talking to a trusted person. Spending time with friends and family. Minimize exposure to UV radiation to reduce your risk of skin cancer. Safety Always wear your seat belt while driving or riding in a vehicle. Do not drive: If you have been drinking alcohol. Do not ride with someone who has been drinking. When you are tired or distracted. While texting. If you have been using any mind-altering substances or drugs. Wear a helmet and other protective equipment during sports activities. If you have firearms in your house, make sure you follow all gun safety procedures. Seek help if you have been physically or sexually abused. What's next? Visit your health care provider once a year for an annual wellness visit. Ask your health care provider how often you should have your eyes and teeth checked. Stay up to date on all vaccines. This information is not intended to replace advice given to you by your health care provider. Make sure you discuss any questions you have with your health care provider. Document Revised: 11/17/2020 Document Reviewed: 11/17/2020 Elsevier Patient Education  2024 [employer] Breast Self-Awareness Breast self-awareness is knowing how your breasts look and feel. You need to: Check your breasts on a regular basis. Tell your doctor about any changes. Become familiar with the look and feel of your breasts. This can help you catch a breast problem while it is still small and can be treated. You should do breast self-exams even if you have breast implants. What you need: A mirror. A well-lit room. A pillow or other  soft object. How to do a breast self-exam Follow these steps to do a breast self-exam: Look for changes  Take off all the clothes above your waist. Stand in front of a mirror in a room with good lighting. Put your hands down at your sides. Compare your breasts in the mirror. Look for any difference between them, such as: A difference in shape. A difference in size. Wrinkles, dips, and bumps in one breast and not the other. Look at each breast for changes in the skin, such as: Redness. Scaly areas. Skin that has gotten thicker. Dimpling. Open sores (ulcers). Look for changes in your nipples, such as: Fluid coming out of a nipple. Fluid around a nipple. Bleeding. Dimpling. Redness. A nipple that looks pushed in (retracted), or that has changed position. Feel for changes Lie on your back. Feel each breast. To do this: Pick a breast to feel. Place a pillow under the shoulder closest to that breast. Put the arm closest to that breast behind your head. Feel the nipple area of that breast using the hand of your other arm. Feel the area with the pads of your three middle fingers by making small circles with your fingers. Use light, medium, and firm pressure. Continue the overlapping circles, moving downward over the breast. Keep making circles with your fingers. Stop when you feel your ribs. Start making circles with your fingers again, this time going  upward until you reach your collarbone. Then, make circles outward across your breast and into your armpit area. Squeeze your nipple. Check for discharge and lumps. Repeat these steps to check your other breast. Sit or stand in the tub or shower. With soapy water on your skin, feel each breast the same way you did when you were lying down. Write down what you find Writing down what you find can help you remember what to tell your doctor. Write down: What is normal for each breast. Any changes you find in each breast. These  include: The kind of changes you find. A tender or painful breast. Any lump you find. Write down its size and where it is. When you last had your monthly period (menstrual cycle). General tips If you are breastfeeding, the best time to check your breasts is after you feed your baby or after you use a breast pump. If you get monthly bleeding, the best time to check your breasts is 5-7 days after your monthly cycle ends. With time, you will become comfortable with the self-exam. You will also start to know if there are changes in your breasts. Contact a doctor if: You see a change in the shape or size of your breasts or nipples. You see a change in the skin of your breast or nipples, such as red or scaly skin. You have fluid coming from your nipples that is not normal. You find a new lump or thick area. You have breast pain. You have any concerns about your breast health. Summary Breast self-awareness includes looking for changes in your breasts and feeling for changes within your breasts. You should do breast self-awareness in front of a mirror in a well-lit room. If you get monthly periods (menstrual cycles), the best time to check your breasts is 5-7 days after your period ends. Tell your doctor about any changes you see in your breasts. Changes include changes in size, changes on the skin, painful or tender breasts, or fluid from your nipples that is not normal. This information is not intended to replace advice given to you by your health care provider. Make sure you discuss any questions you have with your health care provider. Document Revised: 10/27/2021 Document Reviewed: 03/24/2021 Elsevier Patient Education  2024 [employer].

## 2023-10-26 ENCOUNTER — Ambulatory Visit (INDEPENDENT_AMBULATORY_CARE_PROVIDER_SITE_OTHER): Admitting: Certified Nurse Midwife

## 2023-10-26 ENCOUNTER — Other Ambulatory Visit (HOSPITAL_COMMUNITY)
Admission: RE | Admit: 2023-10-26 | Discharge: 2023-10-26 | Disposition: A | Discharge status: Home / Self Care | Source: Ambulatory Visit | Attending: Certified Nurse Midwife | Admitting: Certified Nurse Midwife

## 2023-10-26 ENCOUNTER — Encounter: Payer: Self-pay | Admitting: Certified Nurse Midwife

## 2023-10-26 VITALS — BP 116/78 | HR 69 | Resp 16 | Ht 62.0 in | Wt 168.5 lb

## 2023-10-26 DIAGNOSIS — Z124 Encounter for screening for malignant neoplasm of cervix: Secondary | ICD-10-CM | POA: Insufficient documentation

## 2023-10-26 DIAGNOSIS — Z1231 Encounter for screening mammogram for malignant neoplasm of breast: Secondary | ICD-10-CM

## 2023-10-26 DIAGNOSIS — Z01419 Encounter for gynecological examination (general) (routine) without abnormal findings: Secondary | ICD-10-CM | POA: Insufficient documentation

## 2023-11-01 LAB — CYTOLOGY - PAP
Adequacy: ABSENT
Chlamydia: NEGATIVE
Comment: NEGATIVE
Comment: NEGATIVE
Comment: NEGATIVE
Comment: NORMAL
Diagnosis: NEGATIVE
Diagnosis: REACTIVE
High risk HPV: NEGATIVE
Neisseria Gonorrhea: NEGATIVE
Trichomonas: NEGATIVE

## 2023-11-29 ENCOUNTER — Other Ambulatory Visit: Payer: Self-pay | Admitting: Family

## 2023-12-06 ENCOUNTER — Other Ambulatory Visit: Payer: Self-pay | Admitting: Family

## 2023-12-17 DIAGNOSIS — J40 Bronchitis, not specified as acute or chronic: Secondary | ICD-10-CM | POA: Diagnosis not present

## 2023-12-17 DIAGNOSIS — J441 Chronic obstructive pulmonary disease with (acute) exacerbation: Secondary | ICD-10-CM | POA: Diagnosis not present

## 2023-12-17 DIAGNOSIS — R062 Wheezing: Secondary | ICD-10-CM | POA: Diagnosis not present

## 2023-12-17 DIAGNOSIS — Z03818 Encounter for observation for suspected exposure to other biological agents ruled out: Secondary | ICD-10-CM | POA: Diagnosis not present

## 2024-01-08 ENCOUNTER — Ambulatory Visit: Admitting: Family

## 2024-01-09 ENCOUNTER — Ambulatory Visit: Admitting: Family

## 2024-01-13 ENCOUNTER — Other Ambulatory Visit: Payer: Self-pay | Admitting: Family

## 2024-01-22 ENCOUNTER — Ambulatory Visit: Admitting: Family

## 2024-02-06 ENCOUNTER — Ambulatory Visit
Admission: RE | Admit: 2024-02-06 | Discharge: 2024-02-06 | Disposition: A | Discharge status: Home / Self Care | Source: Ambulatory Visit | Attending: Family | Admitting: Family

## 2024-02-06 ENCOUNTER — Ambulatory Visit: Admitting: Family

## 2024-02-06 ENCOUNTER — Ambulatory Visit
Admission: RE | Admit: 2024-02-06 | Discharge: 2024-02-06 | Disposition: A | Discharge status: Home / Self Care | Attending: Family | Admitting: Family

## 2024-02-06 VITALS — BP 120/78 | HR 85 | Ht 62.0 in | Wt 177.0 lb

## 2024-02-06 DIAGNOSIS — E559 Vitamin D deficiency, unspecified: Secondary | ICD-10-CM | POA: Diagnosis not present

## 2024-02-06 DIAGNOSIS — R5383 Other fatigue: Secondary | ICD-10-CM | POA: Diagnosis not present

## 2024-02-06 DIAGNOSIS — M542 Cervicalgia: Secondary | ICD-10-CM | POA: Insufficient documentation

## 2024-02-06 DIAGNOSIS — E782 Mixed hyperlipidemia: Secondary | ICD-10-CM

## 2024-02-06 DIAGNOSIS — R7303 Prediabetes: Secondary | ICD-10-CM | POA: Diagnosis not present

## 2024-02-06 DIAGNOSIS — I10 Essential (primary) hypertension: Secondary | ICD-10-CM | POA: Diagnosis not present

## 2024-02-06 DIAGNOSIS — E538 Deficiency of other specified B group vitamins: Secondary | ICD-10-CM

## 2024-02-06 DIAGNOSIS — M4802 Spinal stenosis, cervical region: Secondary | ICD-10-CM | POA: Diagnosis not present

## 2024-02-06 NOTE — Progress Notes (Signed)
 Established Patient Office Visit  Subjective:  Patient ID: Karen Russell, female    DOB: August 30, 1975  Age: 48 y.o. MRN: [id number]  Chief Complaint  Patient presents with   Follow-up    4 month follow up    Patient is here today for her 4 months follow up.  She has been feeling fairly well since last appointment.   She does have additional concerns to discuss today.  Patient is here today for her 4 month f/u.  She has been doing well in general, but is having some pain in her neck, upper back and shoulders.   Labs are due today.  She needs refills.   I have reviewed her active problem list, medication list, allergies, notes from last encounter, lab results for her appointment today.    No other concerns at this time.   Past Medical History:  Diagnosis Date   Acute bronchitis 09/30/2017   Arthritis    COPD (chronic obstructive pulmonary disease) (HCC)    Fibromyalgia syndrome    Hyperlipemia    Hypertension    Respiratory distress, acute 09/30/2017   Respiratory failure with hypoxia (HCC) 08/10/2016    Past Surgical History:  Procedure Laterality Date   BREAST BIOPSY Left 09/05/2018   stereo biopsy/ x clip/ path pending   BREAST BIOPSY Left 09/05/2018   us  biopsy/ path pending   KIDNEY SURGERY     TUBAL LIGATION      Social History   Socioeconomic History   Marital status: Single    Spouse name: Not on file   Number of children: Not on file   Years of education: Not on file   Highest education level: Not on file  Occupational History   Not on file  Tobacco Use   Smoking status: Every Day    Current packs/day: 0.50    Types: Cigarettes   Smokeless tobacco: Never  Vaping Use   Vaping status: Never Used  Substance and Sexual Activity   Alcohol use: No   Drug use: No   Sexual activity: Yes  Other Topics Concern   Not on file  Social History Narrative   Not on file   Social Drivers of Health   Financial Resource Strain: High Risk (09/25/2023)    Overall Financial Resource Strain (CARDIA)    Difficulty of Paying Living Expenses: Very hard  Food Insecurity: Food Insecurity Present (09/25/2023)   Hunger Vital Sign    Worried About Running Out of Food in the Last Year: Often true    Ran Out of Food in the Last Year: Often true  Transportation Needs: No Transportation Needs (09/19/2023)   PRAPARE - Administrator, Civil Service (Medical): No    Lack of Transportation (Non-Medical): No  Physical Activity: Unknown (09/30/2017)   Exercise Vital Sign    Days of Exercise per Week: Patient declined    Minutes of Exercise per Session: Patient declined  Stress: Unknown (09/30/2017)   [employer] of Occupational Health - Occupational Stress Questionnaire    Feeling of Stress : Patient declined  Social Connections: Unknown (09/30/2017)   Social Connection and Isolation Panel    Frequency of Communication with Friends and Family: Patient declined    Frequency of Social Gatherings with Friends and Family: Patient declined    Attends Religious Services: Patient declined    Database administrator or Organizations: Patient declined    Attends Banker Meetings: Patient declined    Marital Status: Patient declined  Intimate Partner Violence: Unknown (09/30/2017)   Humiliation, Afraid, Rape, and Kick questionnaire    Fear of Current or Ex-Partner: Patient declined    Emotionally Abused: Patient declined    Physically Abused: Patient declined    Sexually Abused: Patient declined    Family History  Problem Relation Age of Onset   Breast cancer Paternal Aunt    Breast cancer Cousin     Allergies  Allergen Reactions   Augmentin [Amoxicillin -Pot Clavulanate] Nausea And Vomiting    Review of Systems  Musculoskeletal:  Positive for back pain and neck pain.  All other systems reviewed and are negative.      Objective:   BP 120/78   Pulse 85   Ht 5' 2 (1.575 m)   Wt 177 lb (80.3 kg)   SpO2 98%   BMI 32.37  kg/m   Vitals:   02/06/24 1031  BP: 120/78  Pulse: 85  Height: 5' 2 (1.575 m)  Weight: 177 lb (80.3 kg)  SpO2: 98%  BMI (Calculated): 32.37    Physical Exam Vitals and nursing note reviewed.  Constitutional:      Appearance: Normal appearance. She is obese.  HENT:     Head: Normocephalic.  Eyes:     Extraocular Movements: Extraocular movements intact.     Conjunctiva/sclera: Conjunctivae normal.     Pupils: Pupils are equal, round, and reactive to light.  Cardiovascular:     Rate and Rhythm: Normal rate.  Pulmonary:     Effort: Pulmonary effort is normal.  Neurological:     General: No focal deficit present.     Mental Status: She is alert and oriented to person, place, and time. Mental status is at baseline.  Psychiatric:        Mood and Affect: Mood normal.        Behavior: Behavior normal.        Thought Content: Thought content normal.        Judgment: Judgment normal.      No results found for any visits on 02/06/24.  No results found for this or any previous visit (from the past 2160 hours).     Assessment & Plan Neck pain Getting XR of C-spine today at [facility].  Will call pt with results when available   Primary hypertension Blood pressure well controlled with current medications.  Continue current therapy.  Will reassess at follow up.   - CBC w/Diff - CMP w/eGFR  Vitamin D  deficiency B12 deficiency due to diet Other fatigue Checking labs today.  Will continue supplements as needed.   - Vitamin D  - Vitamin B12 - TSH  Mixed hyperlipidemia Checking labs today.  Continue current therapy for lipid control. Will modify as needed based on labwork results.   -CMP w/eGFR -Lipid Panel  Prediabetes A1C Continues to be in prediabetic ranges.  Will reassess at follow up after next lab check.  Patient counseled on dietary choices and verbalized understanding.   -CBC w/Diff -CMP w/eGFR -Hemoglobin A1C     Return in about 4 months (around  06/07/2024).   Total time spent: 20 minutes  ALAN CHRISTELLA ARRANT, FNP  02/06/2024   This document may have been prepared by [facility] Voice Recognition software and as such may include unintentional dictation errors.

## 2024-02-07 LAB — CBC WITH DIFFERENTIAL/PLATELET
Basophils Absolute: 0.1 x10E3/uL (ref 0.0–0.2)
Basos: 1 %
EOS (ABSOLUTE): 0.7 x10E3/uL — ABNORMAL HIGH (ref 0.0–0.4)
Eos: 8 %
Hematocrit: 45.8 % (ref 34.0–46.6)
Hemoglobin: 14.3 g/dL (ref 11.1–15.9)
Immature Grans (Abs): 0 x10E3/uL (ref 0.0–0.1)
Immature Granulocytes: 0 %
Lymphocytes Absolute: 3.1 x10E3/uL (ref 0.7–3.1)
Lymphs: 35 %
MCH: 29.9 pg (ref 26.6–33.0)
MCHC: 31.2 g/dL — ABNORMAL LOW (ref 31.5–35.7)
MCV: 96 fL (ref 79–97)
Monocytes Absolute: 0.7 x10E3/uL (ref 0.1–0.9)
Monocytes: 8 %
Neutrophils Absolute: 4.1 x10E3/uL (ref 1.4–7.0)
Neutrophils: 48 %
Platelets: 293 x10E3/uL (ref 150–450)
RBC: 4.79 x10E6/uL (ref 3.77–5.28)
RDW: 13.1 % (ref 11.7–15.4)
WBC: 8.8 x10E3/uL (ref 3.4–10.8)

## 2024-02-07 LAB — CMP14+EGFR
ALT: 16 IU/L (ref 0–32)
AST: 22 IU/L (ref 0–40)
Albumin: 4.2 g/dL (ref 3.9–4.9)
Alkaline Phosphatase: 84 IU/L (ref 44–121)
BUN/Creatinine Ratio: 10 (ref 9–23)
BUN: 8 mg/dL (ref 6–24)
Bilirubin Total: <0.2 mg/dL (ref 0.0–1.2)
CO2: 21 mmol/L (ref 20–29)
Calcium: 9.6 mg/dL (ref 8.7–10.2)
Chloride: 103 mmol/L (ref 96–106)
Creatinine, Ser: 0.84 mg/dL (ref 0.57–1.00)
Globulin, Total: 2 g/dL (ref 1.5–4.5)
Glucose: 80 mg/dL (ref 70–99)
Potassium: 4.3 mmol/L (ref 3.5–5.2)
Sodium: 139 mmol/L (ref 134–144)
Total Protein: 6.2 g/dL (ref 6.0–8.5)
eGFR: 85 mL/min/1.73 (ref >59)

## 2024-02-07 LAB — VITAMIN D 25 HYDROXY (VIT D DEFICIENCY, FRACTURES): Vit D, 25-Hydroxy: 45.8 ng/mL (ref 30.0–100.0)

## 2024-02-07 LAB — LIPID PANEL
Chol/HDL Ratio: 2.4 ratio (ref 0.0–4.4)
Cholesterol, Total: 156 mg/dL (ref 100–199)
HDL: 64 mg/dL (ref >39)
LDL Chol Calc (NIH): 68 mg/dL (ref 0–99)
Triglycerides: 139 mg/dL (ref 0–149)
VLDL Cholesterol Cal: 24 mg/dL (ref 5–40)

## 2024-02-07 LAB — HEMOGLOBIN A1C
Est. average glucose Bld gHb Est-mCnc: 103 mg/dL
Hgb A1c MFr Bld: 5.2 % (ref 4.8–5.6)

## 2024-02-07 LAB — VITAMIN B12: Vitamin B-12: 585 pg/mL (ref 232–1245)

## 2024-02-10 ENCOUNTER — Encounter: Payer: Self-pay | Admitting: Family

## 2024-02-10 NOTE — Assessment & Plan Note (Signed)
 Blood pressure well controlled with current medications.  Continue current therapy.  Will reassess at follow up.   - CBC w/Diff - CMP w/eGFR

## 2024-02-10 NOTE — Assessment & Plan Note (Signed)
 Checking labs today.  Continue current therapy for lipid control. Will modify as needed based on labwork results.   -CMP w/eGFR -Lipid Panel

## 2024-02-10 NOTE — Assessment & Plan Note (Signed)
 Checking labs today.  Will continue supplements as needed.   - Vitamin D  - Vitamin B12 - TSH

## 2024-02-14 ENCOUNTER — Telehealth: Payer: Self-pay

## 2024-02-14 ENCOUNTER — Other Ambulatory Visit: Payer: Self-pay

## 2024-02-14 MED ORDER — ALBUTEROL SULFATE (2.5 MG/3ML) 0.083% IN NEBU
2.5000 mg | INHALATION_SOLUTION | RESPIRATORY_TRACT | 2 refills | Status: AC | PRN
Start: 2024-02-14 — End: 2025-02-13

## 2024-02-14 NOTE — Telephone Encounter (Signed)
 Patient called asking for rx to be called in for nebulizer solution (albuterol )

## 2024-02-14 NOTE — Addendum Note (Signed)
 Addended byBETHA ORION STABS on: 02/14/2024 10:35 AM   Modules accepted: Orders

## 2024-02-15 ENCOUNTER — Ambulatory Visit: Payer: Self-pay

## 2024-02-22 DIAGNOSIS — M79604 Pain in right leg: Secondary | ICD-10-CM | POA: Diagnosis not present

## 2024-02-22 DIAGNOSIS — M5451 Vertebrogenic low back pain: Secondary | ICD-10-CM | POA: Diagnosis not present

## 2024-02-22 DIAGNOSIS — M25512 Pain in left shoulder: Secondary | ICD-10-CM | POA: Diagnosis not present

## 2024-02-22 DIAGNOSIS — M25511 Pain in right shoulder: Secondary | ICD-10-CM | POA: Diagnosis not present

## 2024-02-22 DIAGNOSIS — M542 Cervicalgia: Secondary | ICD-10-CM | POA: Diagnosis not present

## 2024-02-28 ENCOUNTER — Other Ambulatory Visit: Payer: Self-pay | Admitting: Family

## 2024-03-16 ENCOUNTER — Other Ambulatory Visit: Payer: Self-pay | Admitting: Family

## 2024-03-18 ENCOUNTER — Other Ambulatory Visit: Payer: Self-pay | Admitting: Family

## 2024-03-31 DIAGNOSIS — Z03818 Encounter for observation for suspected exposure to other biological agents ruled out: Secondary | ICD-10-CM | POA: Diagnosis not present

## 2024-03-31 DIAGNOSIS — J40 Bronchitis, not specified as acute or chronic: Secondary | ICD-10-CM | POA: Diagnosis not present

## 2024-04-03 ENCOUNTER — Other Ambulatory Visit: Payer: Self-pay | Admitting: Family

## 2024-04-28 DIAGNOSIS — J40 Bronchitis, not specified as acute or chronic: Secondary | ICD-10-CM | POA: Diagnosis not present

## 2024-04-28 DIAGNOSIS — Z03818 Encounter for observation for suspected exposure to other biological agents ruled out: Secondary | ICD-10-CM | POA: Diagnosis not present

## 2024-05-20 ENCOUNTER — Ambulatory Visit: Admitting: Cardiology

## 2024-05-20 ENCOUNTER — Ambulatory Visit
Admission: RE | Admit: 2024-05-20 | Discharge: 2024-05-20 | Disposition: A | Attending: Cardiology | Admitting: Cardiology

## 2024-05-20 ENCOUNTER — Encounter: Payer: Self-pay | Admitting: Cardiology

## 2024-05-20 ENCOUNTER — Ambulatory Visit
Admission: RE | Admit: 2024-05-20 | Discharge: 2024-05-20 | Disposition: A | Source: Ambulatory Visit | Attending: Cardiology

## 2024-05-20 VITALS — BP 124/79 | HR 84 | Ht 62.0 in | Wt 184.4 lb

## 2024-05-20 DIAGNOSIS — M79644 Pain in right finger(s): Secondary | ICD-10-CM

## 2024-05-20 DIAGNOSIS — I10 Essential (primary) hypertension: Secondary | ICD-10-CM | POA: Diagnosis not present

## 2024-05-20 DIAGNOSIS — M1811 Unilateral primary osteoarthritis of first carpometacarpal joint, right hand: Secondary | ICD-10-CM | POA: Diagnosis not present

## 2024-05-20 MED ORDER — ALBUTEROL SULFATE 108 (90 BASE) MCG/ACT IN AEPB: 1.0000 | INHALATION_SPRAY | Freq: Four times a day (QID) | RESPIRATORY_TRACT | 5 refills | Status: AC | PRN

## 2024-05-20 MED ORDER — MELOXICAM 7.5 MG PO TABS: 7.5000 mg | ORAL_TABLET | Freq: Every day | ORAL | 1 refills | Status: AC

## 2024-05-20 NOTE — Progress Notes (Signed)
 Established Patient Office Visit  Subjective:  Patient ID: Karen Russell, female    DOB: 12-21-1975  Age: 48 y.o. MRN: [id number]  Chief Complaint  Patient presents with   Acute Visit    Right hand/thumb pain and swelling since October.    Patient in office for an acute visit, patient complaining of right hand and thumb pain and swelling. Pain radiates up her arm. Per patient, pain started about 3 months ago. Using ibuprofen  with no relief. Pain is constant, sometimes worse than others. Will get an x-ray. Patient does not remember injuring thumb. Pending x-ray results, will refer to orthopaedics. Will send in meloxicam  for pain.  Blood pressure well controlled today.  Normal kidney function on recent lab work.   Hand Pain  The incident occurred more than 1 week ago. There was no injury mechanism. The pain is present in the right wrist. The pain radiates to the right arm. The pain is at a severity of 10/10. The pain is severe. The pain has been Constant since the incident. Pertinent negatives include no chest pain. The symptoms are aggravated by movement. She has tried NSAIDs, rest and immobilization for the symptoms. The treatment provided no relief.    No other concerns at this time.   Past Medical History:  Diagnosis Date   Acute bronchitis 09/30/2017   Arthritis    COPD (chronic obstructive pulmonary disease) (HCC)    Fibromyalgia syndrome    Hyperlipemia    Hypertension    Respiratory distress, acute 09/30/2017   Respiratory failure with hypoxia (HCC) 08/10/2016    Past Surgical History:  Procedure Laterality Date   BREAST BIOPSY Left 09/05/2018   stereo biopsy/ x clip/ path pending   BREAST BIOPSY Left 09/05/2018   us  biopsy/ path pending   KIDNEY SURGERY     TUBAL LIGATION      Social History   Socioeconomic History   Marital status: Single    Spouse name: Not on file   Number of children: Not on file   Years of education: Not on file   Highest education  level: Not on file  Occupational History   Not on file  Tobacco Use   Smoking status: Every Day    Current packs/day: 0.50    Types: Cigarettes   Smokeless tobacco: Never  Vaping Use   Vaping status: Never Used  Substance and Sexual Activity   Alcohol use: No   Drug use: No   Sexual activity: Yes  Other Topics Concern   Not on file  Social History Narrative   Not on file   Social Drivers of Health   Tobacco Use: High Risk (05/20/2024)   Patient History    Smoking Tobacco Use: Every Day    Smokeless Tobacco Use: Never    Passive Exposure: Not on file  Financial Resource Strain: High Risk (09/25/2023)   Overall Financial Resource Strain (CARDIA)    Difficulty of Paying Living Expenses: Very hard  Food Insecurity: Food Insecurity Present (09/25/2023)   Hunger Vital Sign    Worried About Running Out of Food in the Last Year: Often true    Ran Out of Food in the Last Year: Often true  Transportation Needs: No Transportation Needs (09/19/2023)   PRAPARE - Administrator, Civil Service (Medical): No    Lack of Transportation (Non-Medical): No  Physical Activity: Not on file  Stress: Not on file  Social Connections: Not on file  Intimate Partner Violence: Not on  file  Depression (PHQ2-9): Low Risk (10/26/2023)   Depression (PHQ2-9)    PHQ-2 Score: 0  Alcohol Screen: Not on file  Housing: High Risk (09/19/2023)   Housing Stability Vital Sign    Unable to Pay for Housing in the Last Year: Yes    Number of Times Moved in the Last Year: 0    Homeless in the Last Year: No  Utilities: At Risk (09/25/2023)   AHC Utilities    Threatened with loss of utilities: Yes  Health Literacy: Inadequate Health Literacy (09/19/2023)   B1300 Health Literacy    Frequency of need for help with medical instructions: Sometimes    Family History  Problem Relation Age of Onset   Breast cancer Paternal Aunt    Breast cancer Cousin     Allergies[1]  Show/hide medication  list[2]  Review of Systems  Constitutional: Negative.   HENT: Negative.    Eyes: Negative.   Respiratory: Negative.  Negative for shortness of breath.   Cardiovascular: Negative.  Negative for chest pain.  Gastrointestinal: Negative.  Negative for abdominal pain, constipation and diarrhea.  Genitourinary: Negative.   Musculoskeletal:  Positive for joint pain. Negative for myalgias.  Skin: Negative.   Neurological: Negative.  Negative for dizziness and headaches.  Endo/Heme/Allergies: Negative.   All other systems reviewed and are negative.      Objective:   BP 124/79   Pulse 84   Ht 5' 2 (1.575 m)   Wt 184 lb 6.4 oz (83.6 kg)   SpO2 97%   BMI 33.73 kg/m   Vitals:   05/20/24 1012  BP: 124/79  Pulse: 84  Height: 5' 2 (1.575 m)  Weight: 184 lb 6.4 oz (83.6 kg)  SpO2: 97%  BMI (Calculated): 33.72    Physical Exam Vitals and nursing note reviewed.  Constitutional:      Appearance: Normal appearance. She is normal weight.  HENT:     Head: Normocephalic and atraumatic.     Nose: Nose normal.     Mouth/Throat:     Mouth: Mucous membranes are moist.  Eyes:     Extraocular Movements: Extraocular movements intact.     Conjunctiva/sclera: Conjunctivae normal.     Pupils: Pupils are equal, round, and reactive to light.  Cardiovascular:     Rate and Rhythm: Normal rate and regular rhythm.     Pulses: Normal pulses.     Heart sounds: Normal heart sounds.  Pulmonary:     Effort: Pulmonary effort is normal.     Breath sounds: Normal breath sounds.  Abdominal:     General: Abdomen is flat. Bowel sounds are normal.     Palpations: Abdomen is soft.  Musculoskeletal:        General: Normal range of motion.     Cervical back: Normal range of motion.  Skin:    General: Skin is warm and dry.  Neurological:     General: No focal deficit present.     Mental Status: She is alert and oriented to person, place, and time.  Psychiatric:        Mood and Affect: Mood normal.         Behavior: Behavior normal.        Thought Content: Thought content normal.        Judgment: Judgment normal.      No results found for any visits on 05/20/24.  No results found for this or any previous visit (from the past 2160 hours).    Assessment &  Plan:  Thumb x-ray today Meloxicam  Continue all other medications  Problem List Items Addressed This Visit       Cardiovascular and Mediastinum   HTN (hypertension)     Other   Pain of right thumb - Primary   Relevant Orders   DG Finger Thumb Right    Return if symptoms worsen or fail to improve, for as scheduled with amanda.   Total time spent: 25 minutes. This time includes review of previous notes and results and patient face to face interaction during today's visit.    Jeoffrey Pollen, NP  05/20/2024   This document may have been prepared by [employer] Voice Recognition software and as such may include unintentional dictation errors.      [1]  Allergies Allergen Reactions   Augmentin [Amoxicillin -Pot Clavulanate] Nausea And Vomiting  [2]  Outpatient Medications Prior to Visit  Medication Sig   albuterol  (PROVENTIL ) (2.5 MG/3ML) 0.083% nebulizer solution Take 3 mLs (2.5 mg total) by nebulization every 4 (four) hours as needed for wheezing or shortness of breath.   albuterol  (VENTOLIN  HFA) 108 (90 Base) MCG/ACT inhaler INHALE 1 TO 2 PUFFS INTO THE LUNGS EVERY 6 HOURS AS NEEDED FOR WHEEZING OR SHORTNESS OF BREATH   atenolol  (TENORMIN ) 25 MG tablet Take 1 tablet (25 mg total) by mouth daily.   Bempedoic Acid-Ezetimibe (NEXLIZET ) 180-10 MG TABS Take 1 tablet by mouth daily.   BREZTRI  AEROSPHERE 160-9-4.8 MCG/ACT AERO inhaler INHALE 2 PUFFS INTO THE LUNGS IN THE MORNING AND AT BEDTIME   fluticasone  (FLONASE ) 50 MCG/ACT nasal spray Place 1 spray into both nostrils daily.   gabapentin  (NEURONTIN ) 800 MG tablet TAKE 1 TABLET(800 MG) BY MOUTH THREE TIMES DAILY   montelukast  (SINGULAIR ) 10 MG tablet Take 1 tablet (10  mg total) by mouth at bedtime.   omeprazole  (PRILOSEC) 40 MG capsule TAKE 1 CAPSULE(40 MG) BY MOUTH DAILY   rosuvastatin  (CRESTOR ) 40 MG tablet TAKE 1 TABLET(40 MG) BY MOUTH DAILY   No facility-administered medications prior to visit.

## 2024-05-23 ENCOUNTER — Other Ambulatory Visit: Payer: Self-pay | Admitting: Family

## 2024-06-03 ENCOUNTER — Ambulatory Visit: Payer: Self-pay | Admitting: Cardiology

## 2024-06-03 NOTE — Progress Notes (Signed)
 Pt informed

## 2024-06-09 ENCOUNTER — Encounter: Payer: Self-pay | Admitting: Family

## 2024-06-09 ENCOUNTER — Ambulatory Visit (INDEPENDENT_AMBULATORY_CARE_PROVIDER_SITE_OTHER): Admitting: Family

## 2024-06-09 VITALS — BP 104/78 | HR 80 | Ht 62.0 in | Wt 182.0 lb

## 2024-06-09 DIAGNOSIS — M79644 Pain in right finger(s): Secondary | ICD-10-CM | POA: Diagnosis not present

## 2024-06-09 DIAGNOSIS — E782 Mixed hyperlipidemia: Secondary | ICD-10-CM

## 2024-06-09 DIAGNOSIS — M654 Radial styloid tenosynovitis [de Quervain]: Secondary | ICD-10-CM

## 2024-06-09 DIAGNOSIS — E559 Vitamin D deficiency, unspecified: Secondary | ICD-10-CM

## 2024-06-09 DIAGNOSIS — J449 Chronic obstructive pulmonary disease, unspecified: Secondary | ICD-10-CM | POA: Diagnosis not present

## 2024-06-09 DIAGNOSIS — I10 Essential (primary) hypertension: Secondary | ICD-10-CM | POA: Diagnosis not present

## 2024-06-09 NOTE — Assessment & Plan Note (Signed)
 Continue current therapy for lipid control. Will modify as needed based on labwork results.

## 2024-06-09 NOTE — Assessment & Plan Note (Signed)
 Blood pressure well controlled with current medications.  Continue current therapy.  Will reassess at follow up.

## 2024-06-09 NOTE — Progress Notes (Signed)
 "  Established Patient Office Visit  Subjective:  Patient ID: Karen Russell, female    DOB: 1975-12-11  Age: 49 y.o. MRN: [id number]  Chief Complaint  Patient presents with   Follow-up    4 month follow up    Patient is here today for her 4 months follow up.  She has been feeling fairly well since last appointment.   She does have additional concerns to discuss today.  She has been continuing to have pain in her thumb, despite the meloxicam .  She was already instructed that she has arthritis in her thumb after the recent x-rays, but she was   Labs are not due today.  She needs refills.   I have reviewed her active problem list, medication list, allergies, health maintenance, notes from last encounter, lab results for her appointment today.      No other concerns at this time.   Past Medical History:  Diagnosis Date   Acute bronchitis 09/30/2017   Arthritis    COPD (chronic obstructive pulmonary disease) (HCC)    Fibromyalgia syndrome    Hyperlipemia    Hypertension    Respiratory distress, acute 09/30/2017   Respiratory failure with hypoxia (HCC) 08/10/2016    Past Surgical History:  Procedure Laterality Date   BREAST BIOPSY Left 09/05/2018   stereo biopsy/ x clip/ path pending   BREAST BIOPSY Left 09/05/2018   us  biopsy/ path pending   KIDNEY SURGERY     TUBAL LIGATION      Social History   Socioeconomic History   Marital status: Single    Spouse name: Not on file   Number of children: Not on file   Years of education: Not on file   Highest education level: Not on file  Occupational History   Not on file  Tobacco Use   Smoking status: Every Day    Current packs/day: 0.50    Types: Cigarettes   Smokeless tobacco: Never  Vaping Use   Vaping status: Never Used  Substance and Sexual Activity   Alcohol use: No   Drug use: No   Sexual activity: Yes  Other Topics Concern   Not on file  Social History Narrative   Not on file   Social Drivers of  Health   Tobacco Use: High Risk (06/09/2024)   Patient History    Smoking Tobacco Use: Every Day    Smokeless Tobacco Use: Never    Passive Exposure: Not on file  Financial Resource Strain: High Risk (09/25/2023)   Overall Financial Resource Strain (CARDIA)    Difficulty of Paying Living Expenses: Very hard  Food Insecurity: Food Insecurity Present (09/25/2023)   Hunger Vital Sign    Worried About Running Out of Food in the Last Year: Often true    Ran Out of Food in the Last Year: Often true  Transportation Needs: No Transportation Needs (09/19/2023)   PRAPARE - Administrator, Civil Service (Medical): No    Lack of Transportation (Non-Medical): No  Physical Activity: Not on file  Stress: Not on file  Social Connections: Not on file  Intimate Partner Violence: Not on file  Depression (EYV7-0): Low Risk (10/26/2023)   Depression (PHQ2-9)    PHQ-2 Score: 0  Alcohol Screen: Not on file  Housing: High Risk (09/19/2023)   Housing Stability Vital Sign    Unable to Pay for Housing in the Last Year: Yes    Number of Times Moved in the Last Year: 0  Homeless in the Last Year: No  Utilities: At Risk (09/25/2023)   AHC Utilities    Threatened with loss of utilities: Yes  Health Literacy: Inadequate Health Literacy (09/19/2023)   B1300 Health Literacy    Frequency of need for help with medical instructions: Sometimes    Family History  Problem Relation Age of Onset   Breast cancer Paternal Aunt    Breast cancer Cousin     Allergies[1]  Review of Systems  Musculoskeletal:  Positive for joint pain.  All other systems reviewed and are negative.      Objective:   BP 104/78   Pulse 80   Ht 5' 2 (1.575 m)   Wt 182 lb (82.6 kg)   LMP 05/19/2024   SpO2 98%   BMI 33.29 kg/m   Vitals:   06/09/24 1003  BP: 104/78  Pulse: 80  Height: 5' 2 (1.575 m)  Weight: 182 lb (82.6 kg)  SpO2: 98%  BMI (Calculated): 33.28    Physical Exam Vitals and nursing note  reviewed.  Constitutional:      Appearance: Normal appearance. She is normal weight.  HENT:     Head: Normocephalic.  Eyes:     Extraocular Movements: Extraocular movements intact.     Conjunctiva/sclera: Conjunctivae normal.     Pupils: Pupils are equal, round, and reactive to light.  Cardiovascular:     Rate and Rhythm: Normal rate.  Pulmonary:     Effort: Pulmonary effort is normal.  Neurological:     General: No focal deficit present.     Mental Status: She is alert and oriented to person, place, and time. Mental status is at baseline.  Psychiatric:        Mood and Affect: Mood normal.        Behavior: Behavior normal.        Thought Content: Thought content normal.      No results found for any visits on 06/09/24.  No results found for this or any previous visit (from the past 2160 hours).     Assessment & Plan Chronic obstructive pulmonary disease, unspecified COPD type (HCC) Patient stable.  Well controlled with current therapy.   Continue current meds.   Vitamin D  deficiency Will continue supplements as needed.   Primary hypertension Blood pressure well controlled with current medications.  Continue current therapy.  Will reassess at follow up.   Mixed hyperlipidemia Continue current therapy for lipid control. Will modify as needed based on labwork results.   Pain of right thumb Tendinitis, de Quervain's Suggesting patient use topical pain relief and compression gloves to help with her pain.  If this continues to bother her, I have asked her to call me before her appointment.  Will reassess at follow up.      Return in about 1 month (around 07/10/2024).   Total time spent: 20 minutes  ALAN CHRISTELLA ARRANT, FNP  06/09/2024   This document may have been prepared by [employer] Voice Recognition software and as such may include unintentional dictation errors.     [1]  Allergies Allergen Reactions   Augmentin [Amoxicillin -Pot Clavulanate] Nausea And  Vomiting   "

## 2024-06-09 NOTE — Assessment & Plan Note (Signed)
 Patient stable.  Well controlled with current therapy.   Continue current meds.

## 2024-06-09 NOTE — Assessment & Plan Note (Signed)
 Suggesting patient use topical pain relief and compression gloves to help with her pain.  If this continues to bother her, I have asked her to call me before her appointment.  Will reassess at follow up.

## 2024-06-09 NOTE — Assessment & Plan Note (Signed)
 Will continue supplements as needed.

## 2024-06-14 ENCOUNTER — Other Ambulatory Visit: Payer: Self-pay | Admitting: Family

## 2024-07-10 ENCOUNTER — Ambulatory Visit: Admitting: Family
